# Patient Record
Sex: Male | Born: 1960
Health system: Southern US, Community
[De-identification: ages and names within clinical notes are randomized; demographics above are authoritative.]

## PROBLEM LIST (undated history)

## (undated) DIAGNOSIS — T7840XA Allergy, unspecified, initial encounter: Secondary | ICD-10-CM

## (undated) HISTORY — DX: Allergy, unspecified, initial encounter: T78.40XA

---

## 2008-08-09 ENCOUNTER — Emergency Department (HOSPITAL_COMMUNITY): Admission: EM | Admit: 2008-08-09 | Discharge: 2008-08-09 | Payer: Self-pay | Admitting: Emergency Medicine

## 2008-09-04 ENCOUNTER — Ambulatory Visit: Payer: Self-pay | Admitting: Vascular Surgery

## 2009-08-04 ENCOUNTER — Ambulatory Visit: Payer: Self-pay | Admitting: Vascular Surgery

## 2010-02-09 ENCOUNTER — Encounter: Payer: Self-pay | Admitting: Neurology

## 2010-06-02 NOTE — Procedures (Signed)
CAROTID DUPLEX EXAM   INDICATION:  Right carotid bruit.   HISTORY:  Diabetes:  No.  Cardiac:  No.  Hypertension:  No.  Smoking:  Quit.  Previous Surgery:  No.  CV History:  No.  Amaurosis Fugax No, Paresthesias No, Hemiparesis No                                       RIGHT             LEFT  Brachial systolic pressure:         134               140  Brachial Doppler waveforms:         WNL               WNL  Vertebral direction of flow:        Antegrade         Antegrade  DUPLEX VELOCITIES (cm/sec)  CCA peak systolic                   92                98  ECA peak systolic                   123               58  ICA peak systolic                   57                61  ICA end diastolic                   25                28  PLAQUE MORPHOLOGY:                  None              None  PLAQUE AMOUNT:                      None              None  PLAQUE LOCATION:                    None              None   IMPRESSION:  Patent bilateral internal carotid artery with no evidence  of stenosis.   ___________________________________________  Larina Earthly, M.D.   AC/MEDQ  D:  09/04/2008  T:  09/04/2008  Job:  [id number]

## 2013-07-18 HISTORY — PX: FRACTURE SURGERY: SHX138

## 2013-10-04 ENCOUNTER — Ambulatory Visit (INDEPENDENT_AMBULATORY_CARE_PROVIDER_SITE_OTHER): Payer: Self-pay | Admitting: Family Medicine

## 2013-10-04 VITALS — BP 128/82 | HR 102 | Temp 97.9°F | Resp 16 | Wt 191.6 lb

## 2013-10-04 DIAGNOSIS — R197 Diarrhea, unspecified: Secondary | ICD-10-CM

## 2013-10-04 LAB — COMPREHENSIVE METABOLIC PANEL
ALBUMIN: 4.6 g/dL (ref 3.5–5.2)
ALK PHOS: 75 U/L (ref 39–117)
ALT: 19 U/L (ref 0–53)
AST: 17 U/L (ref 0–37)
BUN: 9 mg/dL (ref 6–23)
CHLORIDE: 102 meq/L (ref 96–112)
CO2: 27 meq/L (ref 19–32)
Calcium: 10.4 mg/dL (ref 8.4–10.5)
Creat: 0.73 mg/dL (ref 0.50–1.35)
GLUCOSE: 85 mg/dL (ref 70–99)
POTASSIUM: 4.5 meq/L (ref 3.5–5.3)
SODIUM: 139 meq/L (ref 135–145)
TOTAL PROTEIN: 7.2 g/dL (ref 6.0–8.3)
Total Bilirubin: 0.8 mg/dL (ref 0.2–1.2)

## 2013-10-04 LAB — POCT CBC
Granulocyte percent: 65.6 %G (ref 37–80)
HEMATOCRIT: 49.2 % (ref 43.5–53.7)
HEMOGLOBIN: 16.2 g/dL (ref 14.1–18.1)
LYMPH, POC: 2 (ref 0.6–3.4)
MCH: 31.6 pg — AB (ref 27–31.2)
MCHC: 32.9 g/dL (ref 31.8–35.4)
MCV: 95.9 fL (ref 80–97)
MID (cbc): 0.5 (ref 0–0.9)
MPV: 7.7 fL (ref 0–99.8)
POC Granulocyte: 4.6 (ref 2–6.9)
POC LYMPH PERCENT: 27.9 %L (ref 10–50)
POC MID %: 6.5 %M (ref 0–12)
Platelet Count, POC: 302 10*3/uL (ref 142–424)
RBC: 5.13 M/uL (ref 4.69–6.13)
RDW, POC: 13.1 %
WBC: 7 10*3/uL (ref 4.6–10.2)

## 2013-10-04 MED ORDER — METRONIDAZOLE 500 MG PO TABS: 500.0000 mg | ORAL_TABLET | Freq: Three times a day (TID) | ORAL | Status: DC

## 2013-10-04 MED ORDER — CIPROFLOXACIN HCL 500 MG PO TABS: 500.0000 mg | ORAL_TABLET | Freq: Two times a day (BID) | ORAL | Status: DC

## 2013-10-04 NOTE — Progress Notes (Signed)
Subjective:    Patient ID: Justin Rios, male    DOB: January 02, 1961, 53 y.o.   MRN: [id number]  10/04/2013  GI Problem   HPI This 53 y.o. male presents for evaluation of abdominal pain, nausea, yellow fuzzy stool that is loose.  +Stomach cramps.  Onset one week ago.  Tried Pepto Bismol and Imodium.  No vomiting.  Last episode of diarrhea, +clear stool with red spots/specks in it.  One week ago, had take out [place] [employer] fried rice; then developed diarrhea two days later.  No chills/sweats; no fever.  Eating toasts, bananas, ginger snaps, potatoes, water, gatorade; drinking plenty of fluids.  Has been taking antibiotics three weeks ago for cellulitis; almost lost leg due to infection.  Then dislocated shoulder.  No recent travel. No sick contacts.  No recent camping. Took antibiotics for one month total due to severe cellulitis.  Took Clindamycin and Bactrim for cellulitis; developed a rash on Bactrim.  Having 3-6 stools per day on average.  Treatment of cellulitis at [facility]  Recently moved from [place] six months ago.   PCP:  none   Review of Systems  Constitutional: Negative for fever, chills, diaphoresis and fatigue.  Gastrointestinal: Positive for abdominal pain, diarrhea and blood in stool. Negative for nausea, vomiting, abdominal distention, anal bleeding and rectal pain.  Skin: Negative for rash.  Neurological: Negative for dizziness, light-headedness and headaches.    Past Medical History  Diagnosis Date  . Allergy    Past Surgical History  Procedure Laterality Date  . Fracture surgery  07/18/2013    R ankle surgery; [facility]   Allergies  Allergen Reactions  . Other     Pt. Is allergic to an antibiotic but can not remember the name   Current Outpatient Prescriptions  Medication Sig Dispense Refill  . oxyCODONE (ROXICODONE) 15 MG immediate release tablet Take 15 mg by mouth every 4 (four) hours as needed for pain.      . ciprofloxacin (CIPRO) 500 MG tablet Take 1 tablet  (500 mg total) by mouth 2 (two) times daily.  20 tablet  0  . metroNIDAZOLE (FLAGYL) 500 MG tablet Take 1 tablet (500 mg total) by mouth 3 (three) times daily.  30 tablet  0   No current facility-administered medications for this visit.       Objective:    BP 128/82  Pulse 102  Temp(Src) 97.9 F (36.6 C) (Oral)  Resp 16  Wt 191 lb 9.6 oz (86.909 kg)  SpO2 97% Physical Exam  Nursing note and vitals reviewed. Constitutional: He is oriented to person, place, and time. He appears well-developed and well-nourished. No distress.  In wheelchair.  HENT:  Head: Normocephalic and atraumatic.  Mouth/Throat: Oropharynx is clear and moist.  Eyes: Conjunctivae and EOM are normal. Pupils are equal, round, and reactive to light.  Neck: Normal range of motion. Neck supple. Carotid bruit is not present. No thyromegaly present.  Cardiovascular: Normal rate, regular rhythm, normal heart sounds and intact distal pulses.  Exam reveals no gallop and no friction rub.   No murmur heard. Pulmonary/Chest: Effort normal and breath sounds normal. He has no wheezes. He has no rales.  Abdominal: Soft. Bowel sounds are normal. He exhibits no distension and no mass. There is no tenderness. There is no rebound and no guarding.  Musculoskeletal:  CAM walker on LLE.  Lymphadenopathy:    He has no cervical adenopathy.  Neurological: He is alert and oriented to person, place, and time. No cranial nerve  deficit.  Skin: Skin is warm and dry. No rash noted. He is not diaphoretic.  Psychiatric: He has a normal mood and affect. His behavior is normal.        Assessment & Plan:   1. Diarrhea    1. Diarrhea:  New.  Onset one week ago. Having 3-6 stools per day.  Recent prolonged antibiotics for LLE cellulitis; recently prescribed Clindamycin and Bactrim.  Send stool culture and C. Difficile studies.  Treat empirically with Cipro and Flagyl.  BRAT diet, hydration. RTC for acute worsening.  Very concerning for C.  Difficile infection; expressed this concern to patient.   Meds ordered this encounter  Medications  . oxyCODONE (ROXICODONE) 15 MG immediate release tablet    Sig: Take 15 mg by mouth every 4 (four) hours as needed for pain.  . ciprofloxacin (CIPRO) 500 MG tablet    Sig: Take 1 tablet (500 mg total) by mouth 2 (two) times daily.    Dispense:  20 tablet    Refill:  0  . metroNIDAZOLE (FLAGYL) 500 MG tablet    Sig: Take 1 tablet (500 mg total) by mouth 3 (three) times daily.    Dispense:  30 tablet    Refill:  0    No Follow-up on file.    Nilda Simmer, M.D.  [facility] [address] [place], [place]  [address] [phone] phone [phone] fax

## 2013-10-04 NOTE — Patient Instructions (Signed)

## 2013-10-05 ENCOUNTER — Telehealth: Payer: Self-pay

## 2013-10-05 NOTE — Telephone Encounter (Signed)
Spoke to Justin Rios- advised we will call him as soon as results are in. He would like a personal call from Dr. Katrinka Blazing- he has a personal question he states he does not feel comfortable discussing with me.

## 2013-10-05 NOTE — Telephone Encounter (Signed)
PT CALLED TO REQUEST THAT HIS LAB RESULTS BE REVIEWED WITH HIM.

## 2013-10-07 LAB — CLOSTRIDIUM DIFFICILE BY PCR: Toxigenic C. Difficile by PCR: DETECTED — CR

## 2013-10-08 LAB — STOOL CULTURE

## 2013-10-08 NOTE — Telephone Encounter (Signed)
Spoke to patient --- 1.  Advised of +Clostridium Difficile; recommend stopping Cipro and continuing Metronidazole.  2.  Pt never started Metronidazole due to fear of side effects. 3. Diarrhea has improved to 2-3 stools per day on Cipro.  A/P:  C. Difficile colitis:  Start Metronidazole; call if no improvement in ten days.  Continue with BRAT diet, hydration.

## 2013-10-26 ENCOUNTER — Encounter (HOSPITAL_BASED_OUTPATIENT_CLINIC_OR_DEPARTMENT_OTHER): Payer: Self-pay | Admitting: Emergency Medicine

## 2013-10-26 ENCOUNTER — Telehealth: Payer: Self-pay

## 2013-10-26 ENCOUNTER — Emergency Department (HOSPITAL_BASED_OUTPATIENT_CLINIC_OR_DEPARTMENT_OTHER)
Admission: EM | Admit: 2013-10-26 | Discharge: 2013-10-26 | Disposition: A | Discharge status: Home / Self Care | Payer: Self-pay | Attending: Emergency Medicine | Admitting: Emergency Medicine

## 2013-10-26 DIAGNOSIS — Z79899 Other long term (current) drug therapy: Secondary | ICD-10-CM | POA: Insufficient documentation

## 2013-10-26 DIAGNOSIS — Z87891 Personal history of nicotine dependence: Secondary | ICD-10-CM | POA: Insufficient documentation

## 2013-10-26 DIAGNOSIS — R197 Diarrhea, unspecified: Secondary | ICD-10-CM | POA: Insufficient documentation

## 2013-10-26 DIAGNOSIS — Z792 Long term (current) use of antibiotics: Secondary | ICD-10-CM | POA: Insufficient documentation

## 2013-10-26 LAB — CBC WITH DIFFERENTIAL/PLATELET
BASOS PCT: 0 % (ref 0–1)
Basophils Absolute: 0 10*3/uL (ref 0.0–0.1)
EOS ABS: 0.2 10*3/uL (ref 0.0–0.7)
Eosinophils Relative: 2 % (ref 0–5)
HCT: 43.6 % (ref 39.0–52.0)
Hemoglobin: 15.2 g/dL (ref 13.0–17.0)
LYMPHS ABS: 1.2 10*3/uL (ref 0.7–4.0)
Lymphocytes Relative: 12 % (ref 12–46)
MCH: 32 pg (ref 26.0–34.0)
MCHC: 34.9 g/dL (ref 30.0–36.0)
MCV: 91.8 fL (ref 78.0–100.0)
Monocytes Absolute: 1.1 10*3/uL — ABNORMAL HIGH (ref 0.1–1.0)
Monocytes Relative: 11 % (ref 3–12)
NEUTROS PCT: 75 % (ref 43–77)
Neutro Abs: 7.5 10*3/uL (ref 1.7–7.7)
PLATELETS: 218 10*3/uL (ref 150–400)
RBC: 4.75 MIL/uL (ref 4.22–5.81)
RDW: 13.2 % (ref 11.5–15.5)
WBC: 10 10*3/uL (ref 4.0–10.5)

## 2013-10-26 LAB — COMPREHENSIVE METABOLIC PANEL
ALBUMIN: 3.8 g/dL (ref 3.5–5.2)
ALK PHOS: 69 U/L (ref 39–117)
ALT: 16 U/L (ref 0–53)
AST: 14 U/L (ref 0–37)
Anion gap: 16 — ABNORMAL HIGH (ref 5–15)
BUN: 14 mg/dL (ref 6–23)
CO2: 23 mEq/L (ref 19–32)
Calcium: 9.9 mg/dL (ref 8.4–10.5)
Chloride: 103 mEq/L (ref 96–112)
Creatinine, Ser: 0.7 mg/dL (ref 0.50–1.35)
GFR calc Af Amer: >90 mL/min (ref >90)
GFR calc non Af Amer: >90 mL/min (ref >90)
GLUCOSE: 126 mg/dL — AB (ref 70–99)
POTASSIUM: 3.8 meq/L (ref 3.7–5.3)
SODIUM: 142 meq/L (ref 137–147)
TOTAL PROTEIN: 7.1 g/dL (ref 6.0–8.3)
Total Bilirubin: 0.8 mg/dL (ref 0.3–1.2)

## 2013-10-26 MED ORDER — SACCHAROMYCES BOULARDII 250 MG PO CAPS: 250.0000 mg | ORAL_CAPSULE | Freq: Two times a day (BID) | ORAL | Status: DC

## 2013-10-26 MED ORDER — VANCOMYCIN HCL 250 MG PO CAPS: ORAL_CAPSULE | ORAL | Status: DC

## 2013-10-26 MED ORDER — VANCOMYCIN HCL IN DEXTROSE 1-5 GM/200ML-% IV SOLN: 1000.0000 mg | Freq: Once | INTRAVENOUS | Status: DC

## 2013-10-26 MED ORDER — SODIUM CHLORIDE 0.9 % IV SOLN
Freq: Once | INTRAVENOUS | Status: AC
  Administered 2013-10-26: 1000 mL via INTRAVENOUS

## 2013-10-26 NOTE — ED Notes (Signed)
Diarrhea 2 days. He was recently treated for cdiff.
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
## 2013-10-26 NOTE — Telephone Encounter (Signed)
Pt of Dr. Katrinka BlazingSmith says that he has taken his medication prescribe on last visit, but all of his symptoms have come back, please advise pt

## 2013-10-26 NOTE — Telephone Encounter (Signed)
Dr Katrinka BlazingSmith advised Treat empirically with Cipro and Flagyl. BRAT diet, hydration. RTC for acute worsening. Very concerning for C. Difficile infection; expressed this concern to patient.  Called advised patient to return to clinic since symptoms have returned. He indicates he will return here [place], since [place] [address] [place].

## 2013-10-26 NOTE — ED Notes (Signed)
Consult to case manager for help with prescription. Waiting on return call

## 2013-10-26 NOTE — Care Management (Signed)
ED CM received call from Raynelle FanningJulie RN at [facility]. Presented [facility] ED with c/o diarrhea + C- diff tx'd with cipro and flagyl not improved. Pt being discharged with prescription for PO vanco 250mg   Pt is uninsured.  Spoke with patient by phone confirmed information. Pt is eligible for MATCH. Discussed MATCH program and the guidelines including the  $3 co-pay per prescription. Patient agreeable with plan.  Pt enrolled and MATCH [facility] [phone] with list of participating pharmacies.  Patient was instructed to present to a  MATCH with prescriptions to a participating Pharmacies from the list given. Pt verbalized  understanding and appreciation. Pt verbalized understanding teach back done. Discussed disposition plan with Raynelle FanningJulie who is agreeable. No further ED CM needs identified.

## 2013-10-26 NOTE — Discharge Instructions (Signed)
Clostridium Difficile Toxin °This is a test which may be done when a patient has diarrhea that lasts for several days, or has abdominal pain, fever, and nausea after antibiotic therapy.  °This test looks for the presence of Clostridium difficile (C.diff.) toxin in a stool sample. C.diff. is a germ (bacterium) that is one of the groups of bacteria that are usually in the colon, called "normal flora." If something upsets the growth of the other normal flora, C.diff. may overgrow and disrupt the balance of bacteria in the colon. C. diff. may produce two toxins, A and B. The combination of overgrowth and toxins may cause prolonged diarrhea. The toxins may damage the lining of the colon and lead to colitis.  °While some cases of C. diff. diarrhea and colitis do not require treatment, others require specific oral antibiotic therapy. Most patients improve as the normal flora re-establishes itself, but about some may have one or more relapses, with symptoms and detectible toxin levels coming back. °PREPARATION FOR TEST °There is no special preparation for the test. A fresh stool sample is collected in a sterile container. The sample should not be mixed with urine or water. The stool should be taken to the lab within an hour. It may be refrigerated or frozen and taken to the lab as soon as possible. The container should be labeled with your name and the date and time of the stool collection.  °NORMAL FINDINGS °Negative Tissue Culture (no toxin identified) °Ranges for normal findings may vary among different laboratories and hospitals. You should always check with your doctor after having lab work or other tests done to discuss the meaning of your test results and whether your values are considered within normal limits. °MEANING OF TEST  °Your caregiver will go over the test results with you and discuss the importance and meaning of your results, as well as treatment options and the need for additional tests if  necessary. °OBTAINING THE TEST RESULTS °It is your responsibility to obtain your test results. Ask the lab or department performing the test when and how you will get your results. °Document Released: 01/28/2004 Document Revised: 03/29/2011 Document Reviewed: 12/14/2007 °ExitCare® Patient Information ©2015 [employer]. This information is not intended to replace advice given to you by your health care provider. Make sure you discuss any questions you have with your health care provider. ° °

## 2013-10-26 NOTE — Care Management (Addendum)
ED CM called [facility] on [address] regarding price of prescription for self-pay. Cost will be $5000.  Spoke with L. Keenan BachelorSofia PA-C concerning the cost of medication, my recommendation is  to prescribe a more affordable treatment. She stated will restart on Flagyl 500mg  PO 3 x day x 3 weeks regimen. Attepted to contact patient with change in treatment plan. LVM to return call ED CM will follow up in the am.

## 2013-10-27 MED ORDER — METRONIDAZOLE 500 MG PO TABS: 500.0000 mg | ORAL_TABLET | Freq: Three times a day (TID) | ORAL | Status: DC

## 2013-10-27 NOTE — ED Provider Notes (Signed)
Patient returns today, unable to fill Rx's for Vancomycin and Florcaster prescribed last night for C. Diff. secondary to financial difficulty. Rx provided for 14 days of Flagyl and follow up with Dr. Russella DarStark this week for further mangement.    Arnoldo HookerShari A Venesha Petraitis, PA-C 10/27/13 1505

## 2013-10-29 LAB — GI PATHOGEN PANEL BY PCR, STOOL
C difficile toxin A/B: POSITIVE
Campylobacter by PCR: NEGATIVE
Cryptosporidium by PCR: NEGATIVE
E COLI (ETEC) LT/ST: NEGATIVE
E COLI (STEC): NEGATIVE
E COLI 0157 BY PCR: NEGATIVE
G lamblia by PCR: NEGATIVE
Norovirus GI/GII: NEGATIVE
Rotavirus A by PCR: NEGATIVE
SALMONELLA BY PCR: NEGATIVE
SHIGELLA BY PCR: NEGATIVE

## 2013-10-29 NOTE — ED Notes (Signed)
Patient called to state that he can not afford his medications and asked if we could help.  Spoke with pharmacy staff and Sharyl NimrodMeredith will follow up with the patient .

## 2013-10-30 NOTE — ED Provider Notes (Signed)
History/physical exam/procedure(s) were performed by non-physician practitioner and as supervising physician I was immediately available for consultation/collaboration. I have reviewed all notes and am in agreement with care and plan.   Hilario Quarryanielle S Tashera Montalvo, MD 10/30/13 [phone]

## 2013-10-31 NOTE — ED Provider Notes (Signed)
History/physical exam/procedure(s) were performed by non-physician practitioner and as supervising physician I was immediately available for consultation/collaboration. I have reviewed all notes and am in agreement with care and plan.   Hilario Quarryanielle S Taitum Alms, MD 10/31/13 [phone]

## 2013-11-06 ENCOUNTER — Telehealth (HOSPITAL_BASED_OUTPATIENT_CLINIC_OR_DEPARTMENT_OTHER): Payer: Self-pay

## 2013-11-06 NOTE — Telephone Encounter (Signed)
Pt informed (+) C Diff, continue w/Vancomycin as prescribed.

## 2013-11-29 ENCOUNTER — Ambulatory Visit: Payer: Self-pay | Attending: General Practice

## 2013-11-29 DIAGNOSIS — M79661 Pain in right lower leg: Secondary | ICD-10-CM | POA: Insufficient documentation

## 2013-11-29 DIAGNOSIS — R262 Difficulty in walking, not elsewhere classified: Secondary | ICD-10-CM | POA: Insufficient documentation

## 2013-11-29 DIAGNOSIS — M25671 Stiffness of right ankle, not elsewhere classified: Secondary | ICD-10-CM | POA: Insufficient documentation

## 2013-12-04 ENCOUNTER — Ambulatory Visit: Payer: Self-pay | Admitting: Rehabilitation

## 2013-12-07 ENCOUNTER — Ambulatory Visit: Payer: Self-pay | Admitting: Rehabilitation

## 2013-12-11 ENCOUNTER — Ambulatory Visit: Payer: Self-pay | Admitting: Rehabilitation

## 2013-12-18 ENCOUNTER — Ambulatory Visit: Payer: Self-pay | Attending: General Practice | Admitting: Rehabilitation

## 2013-12-18 DIAGNOSIS — M25671 Stiffness of right ankle, not elsewhere classified: Secondary | ICD-10-CM | POA: Insufficient documentation

## 2013-12-18 DIAGNOSIS — R262 Difficulty in walking, not elsewhere classified: Secondary | ICD-10-CM | POA: Insufficient documentation

## 2013-12-18 DIAGNOSIS — M79661 Pain in right lower leg: Secondary | ICD-10-CM | POA: Insufficient documentation

## 2013-12-21 ENCOUNTER — Ambulatory Visit: Payer: Self-pay | Admitting: Rehabilitation

## 2013-12-25 ENCOUNTER — Ambulatory Visit: Payer: Self-pay | Admitting: Rehabilitation

## 2013-12-28 ENCOUNTER — Ambulatory Visit: Payer: Self-pay | Admitting: Rehabilitation

## 2013-12-31 ENCOUNTER — Ambulatory Visit: Payer: Self-pay | Admitting: Rehabilitation

## 2014-01-02 ENCOUNTER — Ambulatory Visit: Payer: Self-pay | Admitting: Rehabilitation

## 2014-01-09 ENCOUNTER — Ambulatory Visit: Payer: Self-pay

## 2014-01-16 ENCOUNTER — Ambulatory Visit: Payer: Self-pay | Admitting: Rehabilitation

## 2014-01-21 ENCOUNTER — Encounter: Payer: Self-pay | Admitting: Rehabilitation

## 2014-01-24 ENCOUNTER — Ambulatory Visit: Payer: Self-pay | Attending: General Practice | Admitting: Rehabilitation

## 2014-01-24 DIAGNOSIS — M25671 Stiffness of right ankle, not elsewhere classified: Secondary | ICD-10-CM | POA: Insufficient documentation

## 2014-01-24 DIAGNOSIS — M79661 Pain in right lower leg: Secondary | ICD-10-CM | POA: Insufficient documentation

## 2014-01-24 DIAGNOSIS — R262 Difficulty in walking, not elsewhere classified: Secondary | ICD-10-CM | POA: Insufficient documentation

## 2014-01-29 ENCOUNTER — Ambulatory Visit: Payer: Self-pay | Admitting: Rehabilitation

## 2014-01-31 ENCOUNTER — Ambulatory Visit: Payer: Self-pay | Admitting: Rehabilitation

## 2014-02-05 ENCOUNTER — Ambulatory Visit: Payer: Self-pay | Admitting: Rehabilitation

## 2014-02-07 ENCOUNTER — Ambulatory Visit: Payer: Self-pay | Admitting: Rehabilitation

## 2014-02-11 ENCOUNTER — Ambulatory Visit: Payer: Self-pay | Admitting: Rehabilitation

## 2014-02-14 ENCOUNTER — Ambulatory Visit: Payer: Self-pay | Admitting: Rehabilitation

## 2014-02-18 ENCOUNTER — Encounter: Payer: Self-pay | Admitting: Rehabilitation

## 2014-02-20 ENCOUNTER — Ambulatory Visit: Payer: Self-pay | Attending: General Practice | Admitting: Rehabilitation

## 2014-02-20 DIAGNOSIS — R262 Difficulty in walking, not elsewhere classified: Secondary | ICD-10-CM | POA: Insufficient documentation

## 2014-02-20 DIAGNOSIS — M25671 Stiffness of right ankle, not elsewhere classified: Secondary | ICD-10-CM | POA: Insufficient documentation

## 2014-02-20 DIAGNOSIS — M79661 Pain in right lower leg: Secondary | ICD-10-CM | POA: Insufficient documentation

## 2014-02-25 ENCOUNTER — Ambulatory Visit: Payer: Self-pay | Admitting: Rehabilitation

## 2014-02-27 ENCOUNTER — Ambulatory Visit: Payer: Self-pay | Admitting: Rehabilitation

## 2014-03-04 ENCOUNTER — Ambulatory Visit: Payer: Self-pay | Admitting: Rehabilitation

## 2014-03-07 ENCOUNTER — Encounter: Payer: Self-pay | Admitting: Rehabilitation

## 2014-03-07 ENCOUNTER — Ambulatory Visit: Payer: Self-pay | Admitting: Rehabilitation

## 2014-03-07 DIAGNOSIS — M25671 Stiffness of right ankle, not elsewhere classified: Secondary | ICD-10-CM

## 2014-03-07 DIAGNOSIS — M25571 Pain in right ankle and joints of right foot: Secondary | ICD-10-CM

## 2014-03-07 DIAGNOSIS — R262 Difficulty in walking, not elsewhere classified: Secondary | ICD-10-CM

## 2014-03-07 NOTE — Therapy (Signed)
[facility] [address]  [address] [place], [place], [address] Phone: [phone]   Fax:  [phone]  Physical Therapy Treatment  Patient Details  Name: Kaladin Noseworthy MRN: [id number] Date of Birth: [birth year] Referring Provider:  Forest Becker, MD  Encounter Date: 03/07/2014      PT End of Session - 03/07/14 1159    Visit Number 23   Number of Visits 30   Date for PT Re-Evaluation 03/28/14   PT Start Time 1100   PT Stop Time 1150   PT Time Calculation (min) 50 min      Past Medical History  Diagnosis Date  . Allergy     Past Surgical History  Procedure Laterality Date  . Fracture surgery  07/18/2013    R ankle surgery; [facility]    There were no vitals taken for this visit.  Visit Diagnosis:  Pain in joint, ankle and foot, right - Plan: PT plan of care cert/re-cert  Stiffness of ankle joint, right - Plan: PT plan of care cert/re-cert  Difficulty walking - Plan: PT plan of care cert/re-cert      Subjective Assessment - 03/07/14 1154    Symptoms Pt reports that the JAS splint rep will be meeting [place] here to set up the ankle brace.  reports no significant changes but would like to extend PT visits for a few more weeks to maximize shouder ROM and strength and to assess JAS effects.   Currently in Pain? Yes   Pain Score 1    Pain Location Shoulder   Pain Orientation Right   Aggravating Factors  use   Pain Relieving Factors rest          [facility] PT Assessment - 03/07/14 0001    AROM   Right Shoulder Flexion 150 Degrees   Right Shoulder ABduction 160 Degrees   Right Shoulder Internal Rotation 80 Degrees   Right Shoulder External Rotation 80 Degrees   Right Ankle Dorsiflexion 0   Strength   Right Shoulder Flexion 4+/5   Right Shoulder Extension 5/5   Right Shoulder ABduction 4-/5  pn   Right Shoulder Internal Rotation 5/5   Right Shoulder External Rotation 5/5   Right Ankle Dorsiflexion 4+/5   Right  Ankle Plantar Flexion 4+/5   Right Ankle Inversion 4+/5   Right Ankle Eversion 4+/5                  [facility] Adult PT Treatment/Exercise - 03/07/14 0001    High Level Balance   High Level Balance Comments Sl stance trampoline toss red ball 2x30"   Exercises   Exercises Shoulder   Shoulder Exercises: Seated   Other Seated Exercises overhead press BATCA  B 45# 2x15   Shoulder Exercises: Standing   External Rotation --  R   Theraband Level (Shoulder External Rotation) Level 3 (Green)  2x15   Internal Rotation Right   Theraband Level (Shoulder Internal Rotation) Level 3 (Green)  2x15   Flexion Right;15 reps;Theraband  green; 2 sets   ABduction Right;15 reps;Theraband  green; 2 sets   Row Right  BATCA 35# 2x10   Other Standing Exercises green ball rollups at wall with lift off 2x15   Other Standing Exercises pull down 35# B 2x15   Shoulder Exercises: ROM/Strengthening   Pushups 15 reps  x2 at counter   Shoulder Exercises: Body Blade   Flexion 15 seconds;2 reps   ABduction 15 seconds;2 reps  PT Long Term Goals - 03/07/14 1208    PT LONG TERM GOAL #1   Title be independent with HEP for the ankle and shoulder  3/17   Time 4   Period Weeks   Status On-going   PT LONG TERM GOAL #2   Title ambulate without AD with minimal gait deviations  3/17   Time 4   Period Weeks   Status On-going   PT LONG TERM GOAL #3   Title negotiate stairs with step through sequence 3/17   Time 4   Period Weeks   Status On-going   PT LONG TERM GOAL #4   Title improve ankle DF to 5 to normalize gait 3/17   Time 4   Period Weeks   Status On-going   PT LONG TERM GOAL #5   Title FOTO improved with no greater than 40%  limitation    3/17   Baseline 63%   Time 4   Period Weeks   Status On-going               Plan - 03/07/14 1202    Clinical Impression Statement Pt continues to exhibit improving shoulder ROM, but with a plateau in ankle ROM at  neutral.  The plan is to continue to gain ankle ROM with the use of the JAS splint and continued home stretching.  Pain has been ranging from a 0-5/10 at the shoulder and ankle recently.  Pt denies any ADL limitations regarding his shoulder but still notices the weakness.  Pt's gait is still limited by lack of dorsiflexion ROM available.     Pt will benefit from skilled therapeutic intervention in order to improve on the following deficits Abnormal gait;Decreased strength;Decreased range of motion   Rehab Potential Excellent   Clinical Impairments Affecting Rehab Potential none   PT Frequency 2x / week   PT Duration 4 weeks   PT Treatment/Interventions ADLs/Self Care Home Management;Gait training;Therapeutic exercise;Balance training   PT Next Visit Plan continue ankle proprioception and balance training along with shoulder AROM and strengthening all planes   Consulted and Agree with Plan of Care Patient        Problem List There are no active problems to display for this patient.   Idamae Lusherevis, Kara R, DPT, CMP 03/07/2014, 12:20 PM  [facility] [address]  [address] [place], [place], [address] Phone: [phone]   Fax:  [phone]

## 2014-03-11 ENCOUNTER — Ambulatory Visit: Payer: Self-pay | Admitting: Rehabilitation

## 2014-03-13 ENCOUNTER — Ambulatory Visit: Payer: Self-pay | Admitting: Rehabilitation

## 2014-03-18 ENCOUNTER — Ambulatory Visit: Payer: Self-pay | Admitting: Rehabilitation

## 2014-03-18 ENCOUNTER — Encounter: Payer: Self-pay | Admitting: Rehabilitation

## 2014-03-18 DIAGNOSIS — M25671 Stiffness of right ankle, not elsewhere classified: Secondary | ICD-10-CM

## 2014-03-18 DIAGNOSIS — M25511 Pain in right shoulder: Secondary | ICD-10-CM

## 2014-03-18 NOTE — Patient Instructions (Signed)
Call JAS splint representative regarding issues with the splint. Continue ankle stretches and use of brace as tolerated at home

## 2014-03-18 NOTE — Therapy (Addendum)
[facility] [address]  [place], [place], [address] Phone: [phone]   Fax:  [phone]  Physical Therapy Treatment  Patient Details  Name: Justin Rios MRN: [id number] Date of Birth: 23-Apr-1960 Referring Provider:  Val Eagle, MD  Encounter Date: 03/18/2014      PT End of Session - 03/18/14 1156    Visit Number 24   Number of Visits 30   Date for PT Re-Evaluation 03/28/14   PT Start Time 1100   PT Stop Time 1030   PT Time Calculation (min) 56 min      Past Medical History  Diagnosis Date  . Allergy     Past Surgical History  Procedure Laterality Date  . Fracture surgery  07/18/2013    R ankle surgery; [facility]    There were no vitals taken for this visit.  Visit Diagnosis:  Pain in joint, shoulder region, right  Stiffness of ankle joint, right      Subjective Assessment - 03/18/14 1059    Symptoms Reports increased pain with the JAS splint          [facility] PT Assessment - 03/18/14 0001    ROM / Strength   AROM / PROM / Strength AROM   AROM   Right Shoulder Flexion 155 Degrees   Right Shoulder ABduction 160 Degrees   Right Ankle Dorsiflexion 0                  [facility] Adult PT Treatment/Exercise - 03/18/14 0001    Exercises   Exercises Ankle   Shoulder Exercises: Seated   Elevation Strengthening;15 reps  2 sets at [facility]; 35pounds   Shoulder Exercises: Standing   External Rotation Strengthening  neutral and at 90deg of abd; 3x10 assist for abduction   Theraband Level (Shoulder External Rotation) Level 3 (Green)   Internal Rotation Strengthening  3x10 green theraband;neutral and at 90deg of abd   Theraband Level (Shoulder Internal Rotation) Level 3 (Green)   Other Standing Exercises D2 extension; 3x10 GTB   Manual Therapy   Manual Therapy Other (comment)  stretches: prone quad stretch 3x20", DF stretch 3x20"   Ankle Exercises: Standing   Heel Raises 20 reps  at  [facility]; R only 2x5      Reviewed JAS splint usage.  Called rep.                PT Long Term Goals - 03/07/14 1208    PT LONG TERM GOAL #1   Title be independent with HEP for the ankle and shoulder  3/17   Time 4   Period Weeks   Status On-going   PT LONG TERM GOAL #2   Title ambulate without AD with minimal gait deviations  3/17   Time 4   Period Weeks   Status On-going   PT LONG TERM GOAL #3   Title negotiate stairs with step through sequence 3/17   Time 4   Period Weeks   Status On-going   PT LONG TERM GOAL #4   Title improve ankle DF to 5 to normalize gait 3/17   Time 4   Period Weeks   Status On-going   PT LONG TERM GOAL #5   Title FOTO improved with no greater than 40%  limitation    3/17   Baseline 63%   Time 4   Period Weeks   Status On-going  Plan - 03/18/14 1156    Clinical Impression Statement Tolerated treatment well. Excellent progression of shoulder exercises. Continues with ankle Dorsiflexion to 0-1deg.    PT Next Visit Plan JAS splint representative will be present next visit. Continue shoulder strengthening, Ankle ROM/balance        Problem List There are no active problems to display for this patient.   Stark Bray, DPT, CMP 03/18/2014, 12:05 PM  [facility] [address]  [place], [place], [address] Phone: [phone]   Fax:  [phone]       PHYSICAL THERAPY DISCHARGE SUMMARY  Visits from Start of Care: 24  Current functional level related to goals / functional outcomes: See above; pt did not return to PT therefore unknown whether goals met.  Pt stated he would call to schedule and never scheduled.   Remaining deficits: See above   Education / Equipment: HEP, JAS splint  Plan: Patient agrees to discharge.  Patient goals were not met. Patient is being discharged due to not returning since the last visit.  ?????    Laureen Abrahams, PT, DPT 05/02/2014 7:29 AM  [facility] [place], [place] [address]  [phone] (office) [phone] (fax)

## 2014-03-20 ENCOUNTER — Ambulatory Visit: Payer: Self-pay | Admitting: Rehabilitation

## 2014-03-26 ENCOUNTER — Ambulatory Visit: Payer: Self-pay | Admitting: Rehabilitation

## 2014-03-29 ENCOUNTER — Ambulatory Visit: Payer: Self-pay | Admitting: Rehabilitation

## 2014-04-04 ENCOUNTER — Ambulatory Visit: Payer: Self-pay | Admitting: Rehabilitation

## 2014-04-15 ENCOUNTER — Ambulatory Visit: Payer: Self-pay | Admitting: Rehabilitation

## 2014-10-02 ENCOUNTER — Encounter (HOSPITAL_BASED_OUTPATIENT_CLINIC_OR_DEPARTMENT_OTHER): Payer: Self-pay | Admitting: *Deleted

## 2014-10-02 ENCOUNTER — Emergency Department (HOSPITAL_BASED_OUTPATIENT_CLINIC_OR_DEPARTMENT_OTHER)
Admission: EM | Admit: 2014-10-02 | Discharge: 2014-10-02 | Disposition: A | Discharge status: Home / Self Care | Payer: Self-pay | Attending: Emergency Medicine | Admitting: Emergency Medicine

## 2014-10-02 DIAGNOSIS — Z87891 Personal history of nicotine dependence: Secondary | ICD-10-CM | POA: Insufficient documentation

## 2014-10-02 DIAGNOSIS — H6123 Impacted cerumen, bilateral: Secondary | ICD-10-CM | POA: Insufficient documentation

## 2014-10-02 NOTE — Discharge Instructions (Signed)
Please read and follow all provided instructions.  Your diagnoses today include:  1. Cerumen impaction, bilateral    Tests performed today include:  Vital signs. See below for your results today.   Medications prescribed:   None  Home care instructions:  Follow any educational materials contained in this packet.  Follow-up instructions: Please follow-up with your primary care provider as needed for further evaluation of your symptoms.  Return instructions:   Please return to the Emergency Department if you experience worsening symptoms.   Please return if you have any other emergent concerns.  Additional Information:  Your vital signs today were: BP 162/109 mmHg   Pulse 78   Temp(Src) 98.5 F (36.9 C) (Oral)   Resp 16   Ht  (1.778 m)   Wt 200 lb (90.719 kg)   BMI 28.70 kg/m2   SpO2 100% If your blood pressure (BP) was elevated above 135/85 this visit, please have this repeated by your doctor within one month. ---------------

## 2014-10-02 NOTE — ED Provider Notes (Signed)
CSN: [id number]     Arrival date & time 10/02/14  1254 History   First MD Initiated Contact with Patient 10/02/14 1322     Chief Complaint  Patient presents with  . Otalgia     (Consider location/radiation/quality/duration/timing/severity/associated sxs/prior Treatment) HPI Comments: Patient presents with complaint of right ear pain and decreased hearing for the past month, worse over the past couple of days. Patient states that he does use Q-tips at home at home but does report any acute injury. He does not have much in the way of pain, just a pressure. No other fevers, nausea, vomiting. No runny nose. Patient has tinnitus that is at baseline. He has been worked up for this in the past. No other treatments prior to arrival.  Patient is a 54 y.o. male presenting with ear pain. The history is provided by the patient.  Otalgia Associated symptoms: hearing loss   Associated symptoms: no abdominal pain, no congestion, no cough, no diarrhea, no fever, no headaches, no rash, no rhinorrhea, no sore throat and no vomiting     Past Medical History  Diagnosis Date  . Allergy    Past Surgical History  Procedure Laterality Date  . Fracture surgery  07/18/2013    R ankle surgery; [facility]   No family history on file. Social History  Substance Use Topics  . Smoking status: Former Games developer  . Smokeless tobacco: None  . Alcohol Use: No    Review of Systems  Constitutional: Negative for fever, chills and fatigue.  HENT: Positive for ear pain and hearing loss. Negative for congestion, rhinorrhea, sinus pressure and sore throat.   Eyes: Negative for redness.  Respiratory: Negative for cough and wheezing.   Gastrointestinal: Negative for nausea, vomiting, abdominal pain and diarrhea.  Genitourinary: Negative for dysuria.  Musculoskeletal: Negative for myalgias and neck stiffness.  Skin: Negative for rash.  Neurological: Negative for headaches.  Hematological: Negative for adenopathy.     Allergies  Other  Home Medications   Prior to Admission medications   Medication Sig Start Date End Date Taking? Authorizing Provider  ciprofloxacin (CIPRO) 500 MG tablet Take 1 tablet (500 mg total) by mouth 2 (two) times daily. Patient not taking: Reported on 03/07/2014 10/04/13   Ethelda Chick, MD  metroNIDAZOLE (FLAGYL) 500 MG tablet Take 1 tablet (500 mg total) by mouth 3 (three) times daily. Patient not taking: Reported on 03/07/2014 10/04/13   Ethelda Chick, MD  metroNIDAZOLE (FLAGYL) 500 MG tablet Take 1 tablet (500 mg total) by mouth 3 (three) times daily. Patient not taking: Reported on 03/07/2014 10/27/13   Elpidio Anis, PA-C  oxyCODONE (ROXICODONE) 15 MG immediate release tablet Take 15 mg by mouth every 4 (four) hours as needed for pain.    Historical Provider, MD  saccharomyces boulardii (FLORASTOR) 250 MG capsule Take 1 capsule (250 mg total) by mouth 2 (two) times daily. Patient not taking: Reported on 03/07/2014 10/26/13   Elson Areas, PA-C  vancomycin [facility]) 250 MG capsule Two tablets ( ) po qid x 21 days Patient not taking: Reported on 03/07/2014 10/26/13   Lonia Skinner Sofia, PA-C   BP 162/109 mmHg  Pulse 78  Temp(Src) 98.5 F (36.9 C) (Oral)  Resp 16  Ht  (1.778 m)  Wt 200 lb (90.719 kg)  BMI 28.70 kg/m2  SpO2 100%   Physical Exam  Constitutional: He appears well-developed and well-nourished.  HENT:  Head: Normocephalic and atraumatic.  Right Ear: Tympanic membrane, external ear and ear canal  normal. No drainage, swelling or tenderness. Tympanic membrane is not injected, not perforated and not erythematous. Decreased hearing is noted.  Left Ear: Tympanic membrane, external ear and ear canal normal. No drainage, swelling or tenderness. Tympanic membrane is not injected, not perforated and not erythematous.  Bilateral cerumen impaction. After clearing, TMs appear normal.  Eyes: Conjunctivae are normal.  Neck: Normal range of motion. Neck supple.   Pulmonary/Chest: No respiratory distress.  Neurological: He is alert.  Skin: Skin is warm and dry.  Psychiatric: He has a normal mood and affect.  Nursing note and vitals reviewed.   ED Course  Procedures (including critical care time) Labs Review Labs Reviewed - No data to display  Imaging Review No results found. I have personally reviewed and evaluated these images and lab results as part of my medical decision-making.   EKG Interpretation None       2:11 PM Patient seen and examined. Impaction cleared by nurse. Patient reports much improvement in hearing. Tenderness is at baseline. Normal exam after cerumen impaction cleared.   Patient counseled on measures to decrease chance of impaction.  Vital signs reviewed and are as follows: BP 162/109 mmHg  Pulse 78  Temp(Src) 98.5 F (36.9 C) (Oral)  Resp 16  Ht 5\' 10"  (1.778 m)  Wt 200 lb (90.719 kg)  BMI 28.70 kg/m2  SpO2 100%    MDM   Final diagnoses:  Cerumen impaction, bilateral   Patient with mild ear pain and hearing loss. Cerumen impaction on exam, symptoms improved after clearing.   Renne Crigler, PA-C 10/02/14 1417  Benjiman Core, MD 10/03/14 1540

## 2014-10-02 NOTE — ED Notes (Signed)
Patient c/o R ear pain that has grown worse over the past few days, he states sounds are sounding muffled

## 2014-11-08 ENCOUNTER — Encounter (HOSPITAL_BASED_OUTPATIENT_CLINIC_OR_DEPARTMENT_OTHER): Payer: Self-pay | Admitting: Emergency Medicine

## 2014-11-08 ENCOUNTER — Emergency Department (HOSPITAL_BASED_OUTPATIENT_CLINIC_OR_DEPARTMENT_OTHER)
Admission: EM | Admit: 2014-11-08 | Discharge: 2014-11-08 | Disposition: A | Discharge status: Home / Self Care | Payer: Self-pay | Attending: Emergency Medicine | Admitting: Emergency Medicine

## 2014-11-08 DIAGNOSIS — Z87891 Personal history of nicotine dependence: Secondary | ICD-10-CM | POA: Insufficient documentation

## 2014-11-08 DIAGNOSIS — M25571 Pain in right ankle and joints of right foot: Secondary | ICD-10-CM | POA: Insufficient documentation

## 2014-11-08 DIAGNOSIS — Z8781 Personal history of (healed) traumatic fracture: Secondary | ICD-10-CM | POA: Insufficient documentation

## 2014-11-08 DIAGNOSIS — Z9889 Other specified postprocedural states: Secondary | ICD-10-CM | POA: Insufficient documentation

## 2014-11-08 MED ORDER — IBUPROFEN 800 MG PO TABS: 800.0000 mg | ORAL_TABLET | Freq: Once | ORAL | Status: DC

## 2014-11-08 MED ORDER — IBUPROFEN 800 MG PO TABS: 800.0000 mg | ORAL_TABLET | Freq: Three times a day (TID) | ORAL | Status: DC

## 2014-11-08 MED ORDER — KETOROLAC TROMETHAMINE 60 MG/2ML IM SOLN
60.0000 mg | Freq: Once | INTRAMUSCULAR | Status: DC
  Filled 2014-11-08: qty 2

## 2014-11-08 NOTE — ED Notes (Signed)
Rt foot pain

## 2014-11-08 NOTE — Discharge Instructions (Signed)
Ankle Pain Ankle pain is a common symptom. The bones, cartilage, tendons, and muscles of the ankle joint perform a lot of work each day. The ankle joint holds your body weight and allows you to move around. Ankle pain can occur on either side or back of 1 or both ankles. Ankle pain may be sharp and burning or dull and aching. There may be tenderness, stiffness, redness, or warmth around the ankle. The pain occurs more often when a person walks or puts pressure on the ankle. CAUSES  There are many reasons ankle pain can develop. It is important to work with your caregiver to identify the cause since many conditions can impact the bones, cartilage, muscles, and tendons. Causes for ankle pain include:  Injury, including a break (fracture), sprain, or strain often due to a fall, sports, or a high-impact activity.  Swelling (inflammation) of a tendon (tendonitis).  Achilles tendon rupture.  Ankle instability after repeated sprains and strains.  Poor foot alignment.  Pressure on a nerve (tarsal tunnel syndrome).  Arthritis in the ankle or the lining of the ankle.  Crystal formation in the ankle (gout or pseudogout). DIAGNOSIS  A diagnosis is based on your medical history, your symptoms, results of your physical exam, and results of diagnostic tests. Diagnostic tests may include X-ray exams or a computerized magnetic scan (magnetic resonance imaging, MRI). TREATMENT  Treatment will depend on the cause of your ankle pain and may include:  Keeping pressure off the ankle and limiting activities.  Using crutches or other walking support (a cane or brace).  Using rest, ice, compression, and elevation.  Participating in physical therapy or home exercises.  Wearing shoe inserts or special shoes.  Losing weight.  Taking medications to reduce pain or swelling or receiving an injection.  Undergoing surgery. HOME CARE INSTRUCTIONS   Only take over-the-counter or prescription medicines for  pain, discomfort, or fever as directed by your caregiver.  Put ice on the injured area.  Put ice in a plastic bag.  Place a towel between your skin and the bag.  Leave the ice on for 15-20 minutes at a time, 03-04 times a day.  Keep your leg raised (elevated) when possible to lessen swelling.  Avoid activities that cause ankle pain.  Follow specific exercises as directed by your caregiver.  Record how often you have ankle pain, the location of the pain, and what it feels like. This information may be helpful to you and your caregiver.  Ask your caregiver about returning to work or sports and whether you should drive.  Follow up with your caregiver for further examination, therapy, or testing as directed. SEEK MEDICAL CARE IF:   Pain or swelling continues or worsens beyond 1 week.  You have an oral temperature above 102 F (38.9 C).  You are feeling unwell or have chills.  You are having an increasingly difficult time with walking.  You have loss of sensation or other new symptoms.  You have questions or concerns. MAKE SURE YOU:   Understand these instructions.  Will watch your condition.  Will get help right away if you are not doing well or get worse.   This information is not intended to replace advice given to you by your health care provider. Make sure you discuss any questions you have with your health care provider.   Document Released: 06/24/2009 Document Revised: 03/29/2011 Document Reviewed: 08/06/2014 Elsevier Interactive Patient Education 2016 [employer]   Emergency Department Resource Guide 1) Find a  and Pay Out of Pocket °Although you won't have to find out who is covered by your insurance plan, it is a good idea to ask around and get recommendations. You will then need to call the office and see if the doctor you have chosen will accept you as a new patient and what types of options they offer for patients who are self-pay. Some doctors offer  discounts or will set up payment plans for their patients who do not have insurance, but you will need to ask so you aren't surprised when you get to your appointment. ° °2) Contact Your Local Health Department °Not all health departments have doctors that can see patients for sick visits, but many do, so it is worth a call to see if yours does. If you don't know where your local health department is, you can check in your phone book. The CDC also has a tool to help you locate your state's health department, and many state websites also have listings of all of their local health departments. ° °3) Find a Walk-in Clinic °If your illness is not likely to be very severe or complicated, you may want to try a walk in clinic. These are popping up all over the country in pharmacies, drugstores, and shopping centers. They're usually staffed by nurse practitioners or physician assistants that have been trained to treat common illnesses and complaints. They're usually fairly quick and inexpensive. However, if you have serious medical issues or chronic medical problems, these are probably not your best option. ° °No Primary Care Doctor: °- Call [facility] at  832-8000 - they can help you locate a primary care doctor that  accepts your insurance, provides certain services, etc. °- Physician Referral Service- 1-800-533-3463 ° °Chronic Pain Problems: °Organization         Address  Phone   Notes  °[facility]  (336) 297-2271 Patients need to be referred by their primary care doctor.  ° °Medication Assistance: °Organization         Address  Phone   Notes  °[place] [facility] [address] °[place], [place] [address] (336) 641-8030 --Must be a resident of [place] °-- Must have NO insurance coverage whatsoever (no Medicaid/ Medicare, etc.) °-- The pt. MUST have a primary care doctor that directs their care regularly and follows them in the community °  °[facility]   (866) 331-1348   °[employer]  (888) 892-1162   ° °Agencies that provide inexpensive medical care: °Organization         Address  Phone   Notes  °[facility]  (336) 832-8035   °[facility]    (336) 832-7272   °[facility] [address] °[place], [place] [address] (336) 832-4777   °[facility] [address], °[place] (336) 271-4999   °[facility]    (336) 373-0678   °[facility]    (336) 272-1050   °[facility] ° [address], [place] Phone:  (336) 832-4444, Fax:  (336) 832-4440 Hours of Operation:  9 am - 6 pm, M-F.  Also accepts Medicaid/Medicare and self-pay.  °[facility] ° [address],  Phone: (336) 832-3150, Fax: (336) [phone]. Hours of Operation:  8:30 am - 5:30 pm, M-F.  Also accepts Medicaid and self-pay.  °[facility] [place] [address], [place] Phone: (336) 878-6027   °  161-0960   [facility] [address] [place], [place] [phone], Ext. [phone] Mondays & Thursdays: 7-9 AM.  First 15 patients are seen on a first come, first serve basis.    Medicaid-accepting [facility] Providers:  Organization         Address  Phone   Notes  [facility] [address], [place] [phone] Also accepts self-pay patients.  [facility] [address] Laurell Josephs [place], [place]  [phone]   [facility] [address], [place] [phone]   [facility] [address] [place] [address] [place] [phone]   Renaye Rakers [address], [place]   ([phone] Only accepts [place] patients after they have their name applied to their card.   Self-Pay (no insurance) in [facility]:  Organization         Address  Phone   Notes  Sickle Cell Patients, [facility] [address] [place], [place] [phone]   [facility] [address] [place], [place] [phone]   [facility]  [address], [place] [phone]   [facility]/Dr. Osei-Bonsu  [address] [place] or [address], [place] [phone] Phone number for both [place] and [place] locations is the same.  [facility] [address] [place] [phone]   [facility] [address] [place] or [address] [phone]   [facility] [address] [place] [phone], phone; [phone], fax Sees patients 1st and 3rd Saturday of every month.  Must not qualify for public or private insurance (i.e. Medicaid, Medicare, [place] Health Choice, Veterans' Benefits)  Household income should be no more than 200% of the poverty level The clinic cannot treat you if you are pregnant or think you are pregnant  Sexually transmitted diseases are not treated at the clinic.    Dental Care: Organization         Address  Phone  Notes  [facility] [address] [place], [place] [phone] Accepts children up to age 70 who are enrolled in [place] or [place] Health Choice; pregnant women with a Medicaid card; and children who have applied for Medicaid or [place] Health Choice, but were declined, whose parents can pay a reduced fee at time of service.  [facility] [place]  [address], [place] [phone] Accepts children up to age 40 who are enrolled in [place] or [place] Health Choice; pregnant women with a Medicaid card; and children who have applied for Medicaid or [place] Health Choice, but were declined, whose parents can pay a reduced fee at time of service.  [facility]  [address] [place], [place]  ([phone] Patients are seen by appointment only. Walk-ins are not accepted. [facility] will see patients 16 years of age and older. Monday - Tuesday (8am-5pm) Most Wednesdays (8:30-5pm) $30 per visit, cash only  [facility]  [address], [facility] [phone] Patients are seen by appointment only. Walk-ins are not accepted. [facility] will see patients 22 years of age and older. One Wednesday Evening (Monthly: Volunteer Based).  $  cash only  °[facility]  ([phone]) [phone] for adults; Children under age 4, call Graduate Pediatric Dentistry at ([phone]. Children aged 4-14, please call [phone]) [phone] to request a pediatric application. ° Dental services are provided in all areas of dental care including fillings, crowns and bridges, complete and partial dentures, implants, gum treatment, root canals, and extractions. Preventive care is also provided. Treatment is provided to both adults and children. °Patients are selected via a lottery and there is often a waiting list. °  °[facility] [address], °[place] ° (336) 763-8833 [email or web address] °  °[facility] [address], [place], [place] (336)723-1848, Ext. 123 Second and Fourth Thursday of each month, opens at 6:30 AM; Clinic ends at 9 AM.  Patients are seen on a first-come first-served basis, and a limited number are seen during each clinic.  ° °[facility] ° [address], [place], [place] (336) [phone]   Eligibility Requirements °You must have lived in [place], [place], or [place] counties for at least the last three months. °  You cannot be eligible for state or federal sponsored healthcare insurance, including Veterans Administration, Medicaid, or Medicare. °  You generally cannot be eligible for healthcare insurance through your employer.  °  How to apply: °Eligibility screenings are held every Tuesday and Wednesday  afternoon from 1:00 pm until 4:00 pm. You do not need an appointment for the interview!  °[facility] [address], [place], [place] [phone]   °[facility]  336-342-8273   °[place]  336-703-3100   °[facility]  336-570-6415   ° °Behavioral Health Resources in the Community: °Intensive Outpatient Programs °Organization         Address  Phone  Notes  °[facility] [address], [place], [place] [phone]   °[facility] [address], [place], [place] 336-832-9800   °[facility] [address], [place], [place] ° 336-882-2125   °[facility] [address],  °[place], [place] 1-800-853-5163 or 336-641-4981   °Substance Abuse Resources °Organization         Address  Phone  Notes  °[facility]  336-882-2125   °[facility]  336-784-9470   °[facility]  336-285-9073   °[facility]  336-845-3988   °Residential & Outpatient Substance Abuse Program  1-800-659-3381   °Psychological Services °Organization         Address  Phone  Notes  °[facility]  336- 832-9600   °[facility]  336- 378-7881   °[facility] [address], [place] 1-800-853-5163 or 336-641-4981   ° °Mobile Crisis Teams °Organization         Address  Phone  Notes  °[facility], Mobile Crisis Care Unit  1-877-626-1772   °Assertive °[facility] ° [address] [place], [place] 336-834-9664   °Sharon DeEsch [address] °[place] 336-554-5454   ° °Self-Help/Support Groups °Organization         Address  Phone             Notes  °[facility] - variety of support groups  336- 373-1402 Call for more information  °Narcotics Anonymous (NA), [facility] [address], °[place]  2 meetings at this location  ° °Residential Treatment  Programs °Organization           Address  Phone  Notes  °[facility] [address],    °[place]  1-866-801-8205   °[facility] ° [address], [place], [place] [phone]   °[facility] [address], [place] 336-845-3988 Admissions: 8am-3pm M-F  °[facility] [address]    °[place], [place] 336-841-1104   °[facility] [address], [place], [place] 336-379-7146   °[facility] [address]  °[place], [place] 336-285-9073   °[facility] Intensive Outpatient [address], [place], [place] 336-852-3033   °[facility] [address]  °[place], [place] 1-877-615-2722 or 336-784-9470   °[facility] [address] [place], [place] 336-227-7417 Accepts Medicaid  °[facility] [address]  °[place] 1-800-659-3381 Substance Abuse/Addiction Treatment  ° °[place] [facility]al Health Resources °Organization         Address  Phone  Notes  °[facility]  (888) 581-9988   °Julie Brannon, PhD [address] [place], [place]   (336) 349-5553 or (336) 951-0000   °[facility]   [address] °[place], [place] (336) 349-4454   °[facility] [address], [place], [place] (336) 342-8316 Insurance/Medicaid/sponsorship through [facility]  °[facility] [address]                                    [place], [place] (336) 342-8316 Therapy/tele-psych/case  °[facility] [address]  ° [place],  (336) 349-2233    °Dr. Arfeen  (336) 349-4544   °[facility] [place]  [employer] [place] [facility] 1) [address], [place] °2) [address], [place] °3)  [address], [place] (336) 349-3220 °(336) 342-7768 ° °(336) 342-8140   °[place] Child Abuse [facility] (336) 342-1394 or (336) 342-3537 (After Hours)    ° ° ° °

## 2014-11-08 NOTE — ED Notes (Signed)
Ankle brace applied to rt ankle . Instructed the patient on how to apply and the need to wear while ambulating. Pt verbalized understanding.

## 2014-11-08 NOTE — ED Provider Notes (Signed)
CSN: [id number]     Arrival date & time 11/08/14  1139 History   First MD Initiated Contact with Patient 11/08/14 1203     Chief Complaint  Patient presents with  . Foot Pain     (Consider location/radiation/quality/duration/timing/severity/associated sxs/prior Treatment) Patient is a 54 y.o. male presenting with lower extremity pain. The history is provided by the patient.  Foot Pain This is a chronic problem. The current episode started 1 to 4 weeks ago. The problem occurs constantly. The problem has been gradually worsening (patient states he is on his feet all day with his job). Associated symptoms include joint swelling and numbness. Pertinent negatives include no abdominal pain, chest pain, chills, fever, nausea, vomiting or weakness. The symptoms are aggravated by standing and walking. Treatments tried: oxycodone. Improvement on treatment: patient states he thinks it's too strong, and doesn't like the way it makes him feel.   No injury/trauma.  Patient is ambulatory.  Patient with history of right ankle fracture and surgical repair in July of 2015.  Past Medical History  Diagnosis Date  . Allergy    Past Surgical History  Procedure Laterality Date  . Fracture surgery  07/18/2013    R ankle surgery; [facility]   No family history on file. Social History  Substance Use Topics  . Smoking status: Former Games developermoker  . Smokeless tobacco: None  . Alcohol Use: No    Review of Systems  Constitutional: Negative for fever and chills.  Cardiovascular: Negative for chest pain.  Gastrointestinal: Negative for nausea, vomiting and abdominal pain.  Musculoskeletal: Positive for joint swelling. Negative for gait problem.  Neurological: Positive for numbness. Negative for weakness.      Allergies  Other  Home Medications   Prior to Admission medications   Medication Sig Start Date End Date Taking? Authorizing Provider  ibuprofen (ADVIL,MOTRIN) 800 MG tablet Take 1 tablet (800 mg total)  by mouth 3 (three) times daily. 11/08/14   Croy Drumwright, PA-C   BP 139/89 mmHg  Pulse 80  Temp(Src) 98.3 F (36.8 C) (Oral)  Resp 18  Ht 5\' 10"  (1.778 m)  Wt 205 lb (92.987 kg)  BMI 29.41 kg/m2  SpO2 99% Physical Exam  Constitutional: He is oriented to person, place, and time. He appears well-developed and well-nourished.  HENT:  Head: Atraumatic.  Eyes: Conjunctivae are normal. No scleral icterus.  Neck: No tracheal deviation present.  Cardiovascular:  Pedal pulses 2+ in right foot.  Capillary refill less than 3 seconds.   Pulmonary/Chest: Effort normal. No respiratory distress.  Musculoskeletal: Normal range of motion.  Right ankle joint is minimally TTP.  No deformities.  Mild swelling.  Neurological: He is alert and oriented to person, place, and time.  Sensation intact in all 5 digits in right lower extremity.  Strength 5/5 in plantar dorsiflexion and extension.  Skin: Skin is warm and dry.  Well healed scar above right ankle.  No erythema, warmth, induration, or fluctuance.  Psychiatric: He has a normal mood and affect. His behavior is normal.    ED Course  Procedures (including critical care time) Labs Review Labs Reviewed - No data to display  Imaging Review No results found. I have personally reviewed and evaluated these images and lab results as part of my medical decision-making.   EKG Interpretation None      MDM   Final diagnoses:  Right ankle pain    Right ankle pain.  No injury/trauma.  VSS.  Neurovascularly intact.  Will apply ankle brace.  Follow up with his orthopedist or PCP.  Med resource given.    Cheri Fowler, PA-C 11/08/14 1316  Nelva Nay, MD 11/08/14 [phone]

## 2014-11-27 ENCOUNTER — Encounter (HOSPITAL_BASED_OUTPATIENT_CLINIC_OR_DEPARTMENT_OTHER): Payer: Self-pay

## 2014-11-27 ENCOUNTER — Emergency Department (HOSPITAL_BASED_OUTPATIENT_CLINIC_OR_DEPARTMENT_OTHER)
Admission: EM | Admit: 2014-11-27 | Discharge: 2014-11-27 | Disposition: A | Discharge status: Home / Self Care | Payer: PRIVATE HEALTH INSURANCE | Attending: Emergency Medicine | Admitting: Emergency Medicine

## 2014-11-27 DIAGNOSIS — M25571 Pain in right ankle and joints of right foot: Secondary | ICD-10-CM | POA: Diagnosis present

## 2014-11-27 DIAGNOSIS — Z791 Long term (current) use of non-steroidal anti-inflammatories (NSAID): Secondary | ICD-10-CM | POA: Insufficient documentation

## 2014-11-27 DIAGNOSIS — Z87891 Personal history of nicotine dependence: Secondary | ICD-10-CM | POA: Diagnosis not present

## 2014-11-27 DIAGNOSIS — M79671 Pain in right foot: Secondary | ICD-10-CM | POA: Diagnosis not present

## 2014-11-27 DIAGNOSIS — M25671 Stiffness of right ankle, not elsewhere classified: Secondary | ICD-10-CM

## 2014-11-27 DIAGNOSIS — G8929 Other chronic pain: Secondary | ICD-10-CM | POA: Diagnosis not present

## 2014-11-27 MED ORDER — DICLOFENAC SODIUM 1 % TD GEL: 2.0000 g | Freq: Four times a day (QID) | TRANSDERMAL | Status: DC
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
## 2014-11-27 NOTE — Discharge Instructions (Signed)
1. Medications: alternate naprosyn and tylenol for pain control, use full tear and gel, usual home medications 2. Treatment: rest, ice, elevate and use brace, drink plenty of fluids, gentle stretching 3. Follow Up: Please followup with orthopedics as directed for discussion of your diagnoses and further evaluation after today's visit; if you do not have a primary care doctor use the resource guide provided to find one; Please return to the ER for worsening symptoms or other concerns
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
## 2015-01-14 ENCOUNTER — Emergency Department (HOSPITAL_BASED_OUTPATIENT_CLINIC_OR_DEPARTMENT_OTHER)
Admission: EM | Admit: 2015-01-14 | Discharge: 2015-01-14 | Disposition: A | Discharge status: Home / Self Care | Payer: PRIVATE HEALTH INSURANCE | Attending: Emergency Medicine | Admitting: Emergency Medicine

## 2015-01-14 ENCOUNTER — Encounter (HOSPITAL_BASED_OUTPATIENT_CLINIC_OR_DEPARTMENT_OTHER): Payer: Self-pay

## 2015-01-14 DIAGNOSIS — M79602 Pain in left arm: Secondary | ICD-10-CM | POA: Insufficient documentation

## 2015-01-14 DIAGNOSIS — R202 Paresthesia of skin: Secondary | ICD-10-CM | POA: Insufficient documentation

## 2015-01-14 DIAGNOSIS — Z87891 Personal history of nicotine dependence: Secondary | ICD-10-CM | POA: Diagnosis not present

## 2015-01-14 DIAGNOSIS — Z791 Long term (current) use of non-steroidal anti-inflammatories (NSAID): Secondary | ICD-10-CM | POA: Diagnosis not present

## 2015-01-14 DIAGNOSIS — M5412 Radiculopathy, cervical region: Secondary | ICD-10-CM

## 2015-01-14 DIAGNOSIS — R200 Anesthesia of skin: Secondary | ICD-10-CM | POA: Diagnosis not present

## 2015-01-14 DIAGNOSIS — M542 Cervicalgia: Secondary | ICD-10-CM | POA: Diagnosis present

## 2015-01-14 MED ORDER — GABAPENTIN 100 MG PO CAPS: 100.0000 mg | ORAL_CAPSULE | Freq: Three times a day (TID) | ORAL | Status: DC

## 2015-01-14 MED ORDER — OXYCODONE-ACETAMINOPHEN 5-325 MG PO TABS: 1.0000 | ORAL_TABLET | ORAL | Status: DC | PRN

## 2015-01-14 MED ORDER — HYDROCODONE-ACETAMINOPHEN 5-325 MG PO TABS: 2.0000 | ORAL_TABLET | ORAL | Status: DC | PRN

## 2015-01-14 MED ORDER — PREDNISONE 20 MG PO TABS: 40.0000 mg | ORAL_TABLET | Freq: Every day | ORAL | Status: DC
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
## 2015-01-14 NOTE — Discharge Instructions (Signed)
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
## 2015-02-03 ENCOUNTER — Encounter (HOSPITAL_BASED_OUTPATIENT_CLINIC_OR_DEPARTMENT_OTHER): Payer: Self-pay | Admitting: *Deleted

## 2015-02-03 ENCOUNTER — Emergency Department (HOSPITAL_BASED_OUTPATIENT_CLINIC_OR_DEPARTMENT_OTHER): Payer: PRIVATE HEALTH INSURANCE

## 2015-02-03 ENCOUNTER — Emergency Department (HOSPITAL_BASED_OUTPATIENT_CLINIC_OR_DEPARTMENT_OTHER)
Admission: EM | Admit: 2015-02-03 | Discharge: 2015-02-03 | Disposition: A | Discharge status: Home / Self Care | Payer: PRIVATE HEALTH INSURANCE | Attending: Emergency Medicine | Admitting: Emergency Medicine

## 2015-02-03 DIAGNOSIS — Z79899 Other long term (current) drug therapy: Secondary | ICD-10-CM | POA: Diagnosis not present

## 2015-02-03 DIAGNOSIS — M542 Cervicalgia: Secondary | ICD-10-CM | POA: Insufficient documentation

## 2015-02-03 DIAGNOSIS — R200 Anesthesia of skin: Secondary | ICD-10-CM | POA: Insufficient documentation

## 2015-02-03 DIAGNOSIS — Z87891 Personal history of nicotine dependence: Secondary | ICD-10-CM | POA: Diagnosis not present

## 2015-02-03 DIAGNOSIS — Z7952 Long term (current) use of systemic steroids: Secondary | ICD-10-CM | POA: Diagnosis not present

## 2015-02-03 DIAGNOSIS — M546 Pain in thoracic spine: Secondary | ICD-10-CM | POA: Insufficient documentation

## 2015-02-03 MED ORDER — CYCLOBENZAPRINE HCL 10 MG PO TABS: 10.0000 mg | ORAL_TABLET | Freq: Two times a day (BID) | ORAL | Status: DC | PRN

## 2015-02-03 MED ORDER — KETOROLAC TROMETHAMINE 60 MG/2ML IM SOLN
60.0000 mg | Freq: Once | INTRAMUSCULAR | Status: AC
  Administered 2015-02-03: 60 mg via INTRAMUSCULAR
  Filled 2015-02-03: qty 2

## 2015-02-03 MED FILL — CYCLOBENZAPRINE 10 MG TAB: 10 | 10 days supply | Qty: 20 | Fill #0

## 2015-02-03 NOTE — Discharge Instructions (Signed)
Take your medications as prescribed. I also recommend continuing to take 800mg  Ibuprofen 3 times a day. Call the neurosurgery clinic listed above to schedule a follow up appointment with Dr. Danielle DessElsner for further management. Please return to the Emergency Department if symptoms worsen or new onset of fever, neck stiffness, numbness, tingling, weakness.    Emergency Department Resource Guide 1) Find a Doctor and Pay Out of Pocket Although you won't have to find out who is covered by your insurance plan, it is a good idea to ask around and get recommendations. You will then need to call the office and see if the doctor you have chosen will accept you as a new patient and what types of options they offer for patients who are self-pay. Some doctors offer discounts or will set up payment plans for their patients who do not have insurance, but you will need to ask so you aren't surprised when you get to your appointment.  2) Contact Your Local Health Department Not all health departments have doctors that can see patients for sick visits, but many do, so it is worth a call to see if yours does. If you don't know where your local health department is, you can check in your phone book. The CDC also has a tool to help you locate your state's health department, and many state websites also have listings of all of their local health departments.  3) Find a Walk-in Clinic If your illness is not likely to be very severe or complicated, you may want to try a walk in clinic. These are popping up all over the country in pharmacies, drugstores, and shopping centers. They're usually staffed by nurse practitioners or physician assistants that have been trained to treat common illnesses and complaints. They're usually fairly quick and inexpensive. However, if you have serious medical issues or chronic medical problems, these are probably not your best option.  No Primary Care Doctor: - Call Health Connect at  [phone] -  they can help you locate a primary care doctor that  accepts your insurance, provides certain services, etc. - Physician Referral Service- [phone]  Chronic Pain Problems: Organization         Address  Phone   Notes  [facility]  [phone] Patients need to be referred by their primary care doctor.   Medication Assistance: Organization         Address  Phone   Notes  [facility] Medication [facility] [address] [place], [place] [address] [phone] --Must be a resident of [facility] [place] -- Must have NO insurance coverage whatsoever (no Medicaid/ Medicare, etc.) -- The pt. MUST have a primary care doctor that directs their care regularly and follows them in the community   MedAssist  [phone]   [employer]  [phone]    Agencies that provide inexpensive medical care: Organization         Address  Phone   Notes  [facility]  [phone]   [facility]    [phone]   [facility] [address] [place], [place] [address] [phone]   [facility] [address], [place] [phone]   Planned Parenthood    [phone]   [facility]    [phone]   [facility] and [facility]  [address], [place] Phone:  [phone], Fax:  [phone]  Hours of Operation:  9 am - 6 pm, M-F.  Also accepts Medicaid/Medicare and self-pay.  [facility] for [place], [address], [place] Phone: [phone], Fax: [phone]. Hours of Operation:  8:30 am - 5:30 pm, M-F.  Also accepts Medicaid and self-pay.  [facility] [address] [place] Phone: [phone]   [place], [place], [place] [phone], Ext. [phone] Mondays & Thursdays: 7-9 AM.  First 15 patients are seen on a first come, first serve basis.    [place] Providers:  Organization         Address  Phone   Notes  [facility] [address], [place] [phone] Also accepts self-pay patients.  [facility] [id number] [place], [place]  [phone]   [place], [address], [place] [phone]   [facility] [address] [place] [phone]   Lucianne Lei [address], [place]   [phone] Only accepts [place] Access [place] patients after they have their name applied to their card.   Self-Pay (no insurance) in [facility]:  Organization         Address  Phone   Notes  Sickle Cell Patients, [facility] Internal Medicine [place] [phone]   [facility]gent Care [place] [phone]   [facility] [place]  [place], [place], [place] [phone]   [facility]/Dr. Osei-Bonsu  [address] [place] or [address], [place] [phone] Phone number for both [place] and [employer] locations is the same.  Urgent Medical and [facility] [address], [place] [phone]   [facility] [address], [place] or [address] [phone]   [facility] [address], [place] [phone], phone; [phone], fax Sees patients 1st and 3rd Saturday of every month.  Must not qualify for public or private insurance (i.e. Medicaid, Medicare, [place] Health Choice, Veterans' Benefits)  Household income should be no more than 200% of the poverty level The clinic cannot treat you if you are pregnant or think you are pregnant  Sexually transmitted diseases are not treated at the clinic.    Dental Care: Organization         Address  Phone  Notes  [facility] Department of [facility] [place] [phone] Accepts children up to age 43 who are enrolled in [place] or [place]; pregnant women with a Medicaid card; and children who have applied for Medicaid or [place] Health Choice, but were declined, whose parents can pay a reduced fee at time of service.  [facility] Department of [facility]  [address], [place] [phone] Accepts children up to age 45 who are enrolled in [place] or [place]; pregnant women with a Medicaid card; and children who have applied for Medicaid or [place] Health Choice, but were declined, whose parents can pay a reduced fee at time of service.  [place] Adult Dental Access PROGRAM  [place] [phone] Patients are seen by appointment only. Walk-ins are not accepted. [place] will  see patients 42 years of age and older. Monday - Tuesday (8am-5pm) Most Wednesdays (8:30-5pm) $30 per visit, cash only  [facility] Adult Dental Access PROGRAM  [address], [facility] [phone] Patients are seen by appointment only. Walk-ins are not accepted. [place] will see patients 35 years of age and older. One Wednesday Evening (Monthly: Volunteer Based).  $30 per visit, cash only  [place]  [phone] for adults; Children under age 10, call Graduate Pediatric Dentistry at [phone]. Children aged 32-14, please call [phone] to request a pediatric application.  Dental services are provided in all areas of dental care including fillings, crowns and bridges, complete and partial dentures, implants, gum treatment, root canals, and extractions. Preventive care is also provided. Treatment is provided to both adults and children. Patients are selected via a lottery and there is often a waiting list.   [facility] [address], [place]  [phone] [email or web address]   Rescue  [facility] [address] [place], [place] [phone], Ext. [phone] Second and Fourth Thursday of each month, opens at 6:30 AM; Clinic ends at 9 AM.  Patients are seen on a first-come first-served basis, and a limited number are seen during each clinic.   [facility]  [address] Hillard Danker [place], [place] [phone]   Eligibility Requirements You must have lived in [place], [place], or [place] counties for at least the last three months.   You cannot be eligible for state or federal sponsored [employer], including [employer], [place], or [employer].   You generally cannot be eligible for healthcare insurance through your employer.    How to apply: Eligibility screenings are held every Tuesday and Wednesday afternoon from 1:00 pm until 4:00 pm. You do not need an appointment for the interview!  [facility] [address], [place], [place]   [place]  [place] Department  [place]  [phone]    Behavioral Health Resources in the Community: Intensive Outpatient Programs Organization         Address  Phone  Notes  [place] [facility]. [address], [place], [place] [phone]   [address] [facility] Outpatient [address], [place], [place]   ADS: Alcohol & Drug Svcs [address] [place], [place]   [place] [address],  [place], [place] or [phone]   Substance Abuse Resources Organization         Address  Phone  Notes  Alcohol and Drug Services  [phone]   [place]  [phone]   [facility]   Chinita Pester  [phone]   Residential & Outpatient Substance Abuse Program  [phone]   Psychological Services Organization         Address  Phone  Notes  [facility]   [place]  [phone]   [place] [address], [place] or [phone]    Mobile Crisis Teams Organization         Address  Phone  Notes  [facility]rapeutic Alternatives, Mobile Crisis Care Unit  [phone]   Assertive Psychotherapeutic Services  [address] [place], [place]   [facility] [address] [place] [phone]    Self-Help/Support Groups Organization  Address  Phone             Notes  Mental Health Assoc. of [place] - variety of support groups  336- I7437963 Call for more information  Narcotics Anonymous (NA), [facility] [address], [employer] [place]  2 meetings at this location   Statistician         Address  Phone  Notes  [facility] [address],    [place]  [phone]   [facility]  [address], [place] [address], [place], [place] [phone]   [facility] [address] [place], [place] [phone] Admissions: 8am-3pm M-F  [facility] [address]    [place], [place] [phone]   [facility] [address] [place], [place], [place] [phone]   [facility] [address]  [place], [place] [phone]   [facility] - Intensive Outpatient [address], [place], [place] [phone]   [facility] (Addiction Recovery Care Assoc.) [address] [place].,  [place], [place] [phone] or [phone]   [facility] Services (RTS) [address], [place], [place] [phone] Accepts Medicaid  [facility] [address],  [place] [phone] Substance Abuse/Addiction Treatment   [facility] Organization         Address  Phone  Notes  [facility]  ([phone]   Angie Fava, PhD [address] Ervin Knack [place], [place]   ([phone] or  [phone]   [facility]al   [address] [place], [place] [phone]   [facility] [address], [place], [place] [phone] Insurance/Medicaid/sponsorship through [facility] and Families [address]                                    [place], [place] [phone] Therapy/tele-psych/case  [facility] [address], [place] [phone]    Dr. Lolly Mustache  [phone]   [facility]  United Way [facility] Dept. 1) [address], [place] 2) [address] [place] 3)  [address] [place] [address], [place] [phone]  [phone]   [facility] Child Abuse Hotline [phone] or [phone] (After Hours)

## 2015-02-03 NOTE — ED Notes (Signed)
Neck pain x 2 months. He was seen here for same and given medication but pain has continued.
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
## 2015-02-03 NOTE — ED Notes (Signed)
PA at bedside.

## 2015-02-03 NOTE — ED Notes (Signed)
Patient transported to X-ray 

## 2015-05-27 ENCOUNTER — Encounter (HOSPITAL_BASED_OUTPATIENT_CLINIC_OR_DEPARTMENT_OTHER): Payer: Self-pay | Admitting: *Deleted

## 2015-05-27 ENCOUNTER — Emergency Department (HOSPITAL_BASED_OUTPATIENT_CLINIC_OR_DEPARTMENT_OTHER)
Admission: EM | Admit: 2015-05-27 | Discharge: 2015-05-27 | Disposition: A | Discharge status: Home / Self Care | Payer: PRIVATE HEALTH INSURANCE | Attending: Emergency Medicine | Admitting: Emergency Medicine

## 2015-05-27 DIAGNOSIS — M799 Soft tissue disorder, unspecified: Secondary | ICD-10-CM | POA: Insufficient documentation

## 2015-05-27 DIAGNOSIS — M542 Cervicalgia: Secondary | ICD-10-CM

## 2015-05-27 DIAGNOSIS — M7989 Other specified soft tissue disorders: Secondary | ICD-10-CM

## 2015-05-27 DIAGNOSIS — Z87891 Personal history of nicotine dependence: Secondary | ICD-10-CM | POA: Insufficient documentation

## 2015-05-27 NOTE — ED Notes (Signed)
MD in room

## 2015-05-27 NOTE — ED Provider Notes (Signed)
CSN: [id number]     Arrival date & time 05/27/15  1210 History   First MD Initiated Contact with Patient 05/27/15 1347     Chief Complaint  Patient presents with  . Neck Pain     (Consider location/radiation/quality/duration/timing/severity/associated sxs/prior Treatment) Patient is a 55 y.o. male presenting with neck pain. The history is provided by the patient.  Neck Pain Associated symptoms: numbness   Associated symptoms: no chest pain and no fever   Patient with about a 3 month history of left-sided neck pain and occasional arm pain and numbness in the left fingers. Followed by chiropractor for this. That sounds like it's probably consistent with a radiculopathy. Chiropractor did do regular x-rays without any significant abnormalities. Patient noticed a lump on the back of his neck at the base of the neck just in the past few days. He is here today to find out what that is. Patient also with concern for left ear wax. No ear pain. Patient without any other pacific symptoms no fevers no headaches no nausea or vomiting no chest pain no shortness of breath no abdominal pain. Currently no weakness to the left arm.  Past Medical History  Diagnosis Date  . Allergy    Past Surgical History  Procedure Laterality Date  . Fracture surgery  07/18/2013    R ankle surgery; [facility]   No family history on file. Social History  Substance Use Topics  . Smoking status: Former Games developer  . Smokeless tobacco: None  . Alcohol Use: No    Review of Systems  Constitutional: Negative for fever.  HENT: Negative for congestion and trouble swallowing.   Eyes: Negative for visual disturbance.  Respiratory: Negative for shortness of breath.   Cardiovascular: Negative for chest pain.  Gastrointestinal: Negative for nausea, vomiting and abdominal pain.  Genitourinary: Negative for dysuria.  Musculoskeletal: Positive for neck pain.  Skin: Negative for rash.  Neurological: Positive for numbness.   Hematological: Does not bruise/bleed easily.  Psychiatric/Behavioral: Negative for confusion.      Allergies  Flagyl; Other; Oxycodone; and Tramadol  Home Medications   Prior to Admission medications   Medication Sig Start Date End Date Taking? Authorizing Provider  cyclobenzaprine (FLEXERIL) 10 MG tablet Take 1 tablet (10 mg total) by mouth 2 (two) times daily as needed for muscle spasms. 02/03/15   Barrett Henle, PA-C  diclofenac sodium (VOLTAREN) 1 % GEL Apply 2 g topically 4 (four) times daily. 11/27/14   Hannah Muthersbaugh, PA-C  gabapentin (NEURONTIN) 100 MG capsule Take 1 capsule (100 mg total) by mouth 3 (three) times daily. 01/14/15   Raeford Razor, MD  HYDROcodone-acetaminophen (NORCO/VICODIN) 5-325 MG tablet Take 2 tablets by mouth every 4 (four) hours as needed. 01/14/15   Raeford Razor, MD  ibuprofen (ADVIL,MOTRIN) 800 MG tablet Take 1 tablet (800 mg total) by mouth 3 (three) times daily. 11/08/14   Cheri Fowler, PA-C  predniSONE (DELTASONE) 20 MG tablet Take 2 tablets (40 mg total) by mouth daily. 01/14/15   Raeford Razor, MD   BP 151/98 mmHg  Pulse 74  Temp(Src) 98.1 F (36.7 C) (Oral)  Resp 18  Ht  (1.753 m)  Wt 92.987 kg  BMI 30.26 kg/m2  SpO2 96% Physical Exam  Constitutional: He is oriented to person, place, and time. He appears well-developed and well-nourished. No distress.  HENT:  Head: Normocephalic and atraumatic.  Right Ear: External ear normal.  Left Ear: External ear normal.  Mouth/Throat: Oropharynx is clear and moist.  Left  ear canal partially occluded with ear wax.  Eyes: Conjunctivae and EOM are normal. Pupils are equal, round, and reactive to light.  Neck: Normal range of motion. Neck supple.  Posterior neck at the base of the neck with about a 4 x 4 centimeter area of soft tissue mass mobile not erythematous nontender suggestive of a lipoma.  Cardiovascular: Normal rate, regular rhythm and normal heart sounds.   No murmur  heard. Pulmonary/Chest: Effort normal and breath sounds normal. No respiratory distress.  Abdominal: Soft. Bowel sounds are normal. There is no tenderness.  Musculoskeletal: Normal range of motion. He exhibits no edema or tenderness.  Neurological: He is alert and oriented to person, place, and time. No cranial nerve deficit. He exhibits normal muscle tone. Coordination normal.  Skin: Skin is warm. No rash noted.  Nursing note and vitals reviewed.   ED Course  Procedures (including critical care time) Labs Review Labs Reviewed - No data to display  Imaging Review No results found. I have personally reviewed and evaluated these images and lab results as part of my medical decision-making.   EKG Interpretation None      MDM   Final diagnoses:  Neck pain  Soft tissue mass   Patient with a soft tissue mass on the base of the neck posteriorly that is mobile suggestive of a lipoma. However sometimes endocrine thyroid problems can cause fat deposits in that area. Patient also clearly with symptoms for about 3 months consistent with a left-sided radiculopathy from the neck. Occasionally has numbness and tingling in the fingers predominantly last 2 fingers. Been seen by chiropractor for this. Has had x-rays of the neck that have been negative. No severe pain associated with radiculopathy at this point in time.  Counseled the patient that he really needs a primary care doctor or to use an urgent care as a primary care doctor would recommend thyroid studies good lab screening and an MRI of the neck to evaluate the nerve roots and also that would give some information about the soft tissue mass which I think is really a lipoma.  In addition patient also concerned with ear wax buildup. Left ear canal with partial occlusion due to ear wax. Right ear canal with minimal ear wax. No terrible impaction. Recommended 2 drops of olive oil into the left ear each night.    Patient nontoxic no acute  distress.    Vanetta MuldersScott Kazzandra Desaulniers, MD 05/27/15 [phone]
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
## 2015-05-27 NOTE — Discharge Instructions (Signed)
As we discussed. Would recommend MRI of the neck. Would recommend thyroid studies. Would recommend getting a primary care doctor or using one of the local urgent cares in the meantime. Suspect that the soft tissue mass on the neck is probably a lipoma. Further evaluation needed.

## 2016-01-20 ENCOUNTER — Encounter (HOSPITAL_BASED_OUTPATIENT_CLINIC_OR_DEPARTMENT_OTHER): Payer: Self-pay | Admitting: *Deleted

## 2016-01-20 ENCOUNTER — Emergency Department (HOSPITAL_BASED_OUTPATIENT_CLINIC_OR_DEPARTMENT_OTHER)
Admission: EM | Admit: 2016-01-20 | Discharge: 2016-01-20 | Disposition: A | Discharge status: Home / Self Care | Payer: PRIVATE HEALTH INSURANCE | Attending: Physician Assistant | Admitting: Physician Assistant

## 2016-01-20 ENCOUNTER — Emergency Department (HOSPITAL_BASED_OUTPATIENT_CLINIC_OR_DEPARTMENT_OTHER): Payer: PRIVATE HEALTH INSURANCE

## 2016-01-20 DIAGNOSIS — J40 Bronchitis, not specified as acute or chronic: Secondary | ICD-10-CM | POA: Insufficient documentation

## 2016-01-20 DIAGNOSIS — Z79899 Other long term (current) drug therapy: Secondary | ICD-10-CM | POA: Insufficient documentation

## 2016-01-20 DIAGNOSIS — Z87891 Personal history of nicotine dependence: Secondary | ICD-10-CM | POA: Insufficient documentation

## 2016-01-20 LAB — D-DIMER, QUANTITATIVE: D-Dimer, Quant: <0.27 ug{FEU}/mL (ref 0.00–0.50)

## 2016-01-20 MED ORDER — ALBUTEROL SULFATE HFA 108 (90 BASE) MCG/ACT IN AERS: 1.0000 | INHALATION_SPRAY | Freq: Four times a day (QID) | RESPIRATORY_TRACT | 0 refills | Status: DC | PRN

## 2016-01-20 MED ORDER — HYDROCOD POLST-CPM POLST ER 10-8 MG/5ML PO SUER: 5.0000 mL | Freq: Two times a day (BID) | ORAL | 0 refills | Status: DC | PRN

## 2016-01-20 MED ORDER — PREDNISONE 20 MG PO TABS: 40.0000 mg | ORAL_TABLET | Freq: Every day | ORAL | 0 refills | Status: DC

## 2016-01-20 MED ORDER — IPRATROPIUM-ALBUTEROL 0.5-2.5 (3) MG/3ML IN SOLN
3.0000 mL | Freq: Once | RESPIRATORY_TRACT | Status: AC
  Administered 2016-01-20: 3 mL via RESPIRATORY_TRACT
  Filled 2016-01-20: qty 3

## 2016-01-20 MED ORDER — PREDNISONE 10 MG PO TABS
60.0000 mg | ORAL_TABLET | Freq: Once | ORAL | Status: AC
  Administered 2016-01-20: 22:00:00 60 mg via ORAL
  Filled 2016-01-20: qty 1

## 2016-01-20 MED ORDER — BENZONATATE 100 MG PO CAPS
100.0000 mg | ORAL_CAPSULE | Freq: Once | ORAL | Status: AC
  Administered 2016-01-20: 100 mg via ORAL
  Filled 2016-01-20: qty 1

## 2016-01-20 NOTE — ED Provider Notes (Signed)
[facility] Provider Note   CSN: [id number] Arrival date & time: 01/20/16  1729  By signing my name below, I, Linna Darner, attest that this documentation has been prepared under the direction and in the presence of Demetrios Loll, PA-C. Electronically Signed: Linna Darner, Scribe. 01/20/2016. 9:50 PM.  History   Chief Complaint Chief Complaint  Patient presents with  . Cough    The history is provided by the patient. No language interpreter was used.    HPI Comments: Justin Rios is a 56 y.o. male who presents to the Emergency Department complaining of a persistent cough for about 5 weeks. He states his cough is mostly dry but occasionally productive upon waking in the morning. He notes he has occasionally coughed up small amounts of blood since onset. He reports associated rhinorrhea and congestion and states his throat itches. He has tried various OTC medications with no improvement of his symptoms. He notes his blood pressure was elevated in triage tonight but denies h/o HTN. He states he smokes cigarettes occasionally but reports he is not a regular smoker. No h/o lung problems. He states he has several sick contacts with similar symptoms. No recent immobilizations. He denies fever, chills, sore throat, CP, SOB, leg pain, or any other associated symptoms.  Past Medical History:  Diagnosis Date  . Allergy     There are no active problems to display for this patient.   Past Surgical History:  Procedure Laterality Date  . FRACTURE SURGERY  07/18/2013   R ankle surgery; [facility]       Home Medications    Prior to Admission medications   Medication Sig Start Date End Date Taking? Authorizing Provider  cyclobenzaprine (FLEXERIL) 10 MG tablet Take 1 tablet (10 mg total) by mouth 2 (two) times daily as needed for muscle spasms. 02/03/15   Barrett Henle, PA-C  diclofenac sodium (VOLTAREN) 1 % GEL Apply 2 g topically 4 (four) times daily. 11/27/14    Hannah Muthersbaugh, PA-C  gabapentin (NEURONTIN) 100 MG capsule Take 1 capsule (100 mg total) by mouth 3 (three) times daily. 01/14/15   Raeford Razor, MD  HYDROcodone-acetaminophen (NORCO/VICODIN) 5-325 MG tablet Take 2 tablets by mouth every 4 (four) hours as needed. 01/14/15   Raeford Razor, MD  ibuprofen (ADVIL,MOTRIN) 800 MG tablet Take 1 tablet (800 mg total) by mouth 3 (three) times daily. 11/08/14   Cheri Fowler, PA-C  predniSONE (DELTASONE) 20 MG tablet Take 2 tablets (40 mg total) by mouth daily. 01/14/15   Raeford Razor, MD    Family History No family history on file.  Social History Social History  Substance Use Topics  . Smoking status: Former Games developer  . Smokeless tobacco: Never Used  . Alcohol use No     Allergies   Flagyl [metronidazole]; Other; Oxycodone; and Tramadol   Review of Systems Review of Systems  Constitutional: Negative for chills and fever.  HENT: Positive for congestion and rhinorrhea. Negative for sore throat.   Respiratory: Positive for cough. Negative for shortness of breath.   Cardiovascular: Negative for chest pain.  Musculoskeletal: Negative for myalgias.  All other systems reviewed and are negative.    Physical Exam Updated Vital Signs BP 154/98 (BP Location: Left Arm)   Pulse 78   Temp 98 F (36.7 C) (Oral)   Resp 20   Ht 5\' 9"  (1.753 m)   Wt 93 kg   SpO2 99%   BMI 30.27 kg/m   Physical Exam  Constitutional: He is oriented  to person, place, and time. He appears well-developed and well-nourished. No distress.  HENT:  Head: Normocephalic and atraumatic.  Eyes: Conjunctivae and EOM are normal. Right eye exhibits no discharge. Left eye exhibits no discharge. No scleral icterus.  Neck: Normal range of motion. Neck supple. No tracheal deviation present.  Cardiovascular: Normal rate, regular rhythm, normal heart sounds and intact distal pulses.  Exam reveals no gallop and no friction rub.   No murmur heard. Pulmonary/Chest: Effort  normal and breath sounds normal. No tachypnea. No respiratory distress. He has no decreased breath sounds. He has no wheezes.  Musculoskeletal: Normal range of motion.  Lymphadenopathy:    He has no cervical adenopathy.  Neurological: He is alert and oriented to person, place, and time.  Skin: Skin is warm and dry. Capillary refill takes less than 2 seconds. No pallor.  Psychiatric: He has a normal mood and affect. His behavior is normal.  Nursing note and vitals reviewed.    ED Treatments / Results  Labs (all labs ordered are listed, but only abnormal results are displayed) Labs Reviewed  D-DIMER, QUANTITATIVE (NOT AT [facility])    EKG  EKG Interpretation None       Radiology Dg Chest 2 View  Result Date: 01/20/2016 CLINICAL DATA:  Cough for 1 and half months. EXAM: CHEST  2 VIEW COMPARISON:  None FINDINGS: The heart size and mediastinal contours are within normal limits. Both lungs are clear. The visualized skeletal structures are unremarkable. IMPRESSION: No active cardiopulmonary disease. No evidence of pneumonia or pulmonary edema. Electronically Signed   By: Bary RichardStan  Maynard M.D.   On: 01/20/2016 18:07    Procedures Procedures (including critical care time)  DIAGNOSTIC STUDIES: Oxygen Saturation is 99% on RA, normal by my interpretation.    COORDINATION OF CARE: 9:58 PM Discussed treatment plan with pt at bedside and pt agreed to plan.  Medications Ordered in ED Medications  ipratropium-albuterol (DUONEB) 0.5-2.5 (3) MG/3ML nebulizer solution 3 mL (3 mLs Nebulization Given 01/20/16 2207)  predniSONE (DELTASONE) tablet 60 mg (60 mg Oral Given 01/20/16 2209)  benzonatate (TESSALON) capsule 100 mg (100 mg Oral Given 01/20/16 2209)     Initial Impression / Assessment and Plan / ED Course  I have reviewed the triage vital signs and the nursing notes.  Pertinent labs & imaging results that were available during my care of the patient were reviewed by me and considered in my  medical decision making (see chart for details).  Clinical Course   Patient presents with cough and hemoptysis. Patient travels back and forth to [place]. Denies sob or cough. D dimer negative. Low suspicion for PE. CXR without signs of infiltrate. Lungs are CTAB. Likely viral bronchitis. Given steroids, albuterol, and cough medicine. Encouraged OTC symptomatic treatment. Pt is hemodynamically stable, in NAD, & able to ambulate in the ED. Pt has no complaints prior to dc. Pt is comfortable with above plan and is stable for discharge at this time. All questions were answered prior to disposition. Strict return precautions for f/u to the ED were discussed.   Final Clinical Impressions(s) / ED Diagnoses   Final diagnoses:  Bronchitis    New Prescriptions Discharge Medication List as of 01/20/2016 10:49 PM    START taking these medications   Details  albuterol (PROVENTIL HFA;VENTOLIN HFA) 108 (90 Base) MCG/ACT inhaler Inhale 1-2 puffs into the lungs every 6 (six) hours as needed for wheezing or shortness of breath., Starting Tue 01/20/2016, Print    chlorpheniramine-HYDROcodone [facility](TUSSIONEX PENNKINETIC ER)  10-8 MG/5ML SUER Take 5 mLs by mouth every 12 (twelve) hours as needed for cough., Starting Tue 01/20/2016, Print       I personally performed the services described in this documentation, which was scribed in my presence. The recorded information has been reviewed and is accurate.    Rise Mu, PA-C 01/22/16 2124    Courteney Randall An, MD 01/23/16 2311

## 2016-01-20 NOTE — Discharge Instructions (Signed)
This is likely bronchitis. Please take the prednisone once a day for 3 days. Use the albuterol inhaler as needed. Take the cough medicine at night for cough. Continue using your over-the-counter Mucinex. This is likely a viral illness. If your symptoms persist please return to the ED. Follow-up with your primary care doctor. He will need his blood pressure rechecked.

## 2016-01-21 MED FILL — HYDROCODONE-CHLORPHENIRAM S: 10-8 | 10 days supply | Qty: 100 | Fill #0

## 2016-01-30 ENCOUNTER — Emergency Department (HOSPITAL_BASED_OUTPATIENT_CLINIC_OR_DEPARTMENT_OTHER): Payer: PRIVATE HEALTH INSURANCE

## 2016-01-30 ENCOUNTER — Encounter (HOSPITAL_BASED_OUTPATIENT_CLINIC_OR_DEPARTMENT_OTHER): Payer: Self-pay | Admitting: *Deleted

## 2016-01-30 ENCOUNTER — Emergency Department (HOSPITAL_BASED_OUTPATIENT_CLINIC_OR_DEPARTMENT_OTHER)
Admission: EM | Admit: 2016-01-30 | Discharge: 2016-01-30 | Disposition: A | Discharge status: Home / Self Care | Payer: PRIVATE HEALTH INSURANCE | Attending: Dermatology | Admitting: Dermatology

## 2016-01-30 DIAGNOSIS — Z79899 Other long term (current) drug therapy: Secondary | ICD-10-CM | POA: Insufficient documentation

## 2016-01-30 DIAGNOSIS — Z791 Long term (current) use of non-steroidal anti-inflammatories (NSAID): Secondary | ICD-10-CM | POA: Insufficient documentation

## 2016-01-30 DIAGNOSIS — R05 Cough: Secondary | ICD-10-CM | POA: Insufficient documentation

## 2016-01-30 DIAGNOSIS — Z79891 Long term (current) use of opiate analgesic: Secondary | ICD-10-CM | POA: Insufficient documentation

## 2016-01-30 DIAGNOSIS — Z5321 Procedure and treatment not carried out due to patient leaving prior to being seen by health care provider: Secondary | ICD-10-CM | POA: Insufficient documentation

## 2016-01-30 NOTE — ED Triage Notes (Signed)
Continued cough. He was here a week ago for same.

## 2016-01-30 NOTE — ED Notes (Signed)
Pt left and said he would come back later to registration

## 2016-01-31 ENCOUNTER — Emergency Department (HOSPITAL_BASED_OUTPATIENT_CLINIC_OR_DEPARTMENT_OTHER): Payer: Self-pay

## 2016-01-31 ENCOUNTER — Emergency Department (HOSPITAL_BASED_OUTPATIENT_CLINIC_OR_DEPARTMENT_OTHER)
Admission: EM | Admit: 2016-01-31 | Discharge: 2016-01-31 | Disposition: A | Discharge status: Home / Self Care | Payer: Self-pay | Attending: Emergency Medicine | Admitting: Emergency Medicine

## 2016-01-31 ENCOUNTER — Encounter (HOSPITAL_BASED_OUTPATIENT_CLINIC_OR_DEPARTMENT_OTHER): Payer: Self-pay | Admitting: Emergency Medicine

## 2016-01-31 DIAGNOSIS — J069 Acute upper respiratory infection, unspecified: Secondary | ICD-10-CM | POA: Insufficient documentation

## 2016-01-31 DIAGNOSIS — Z87891 Personal history of nicotine dependence: Secondary | ICD-10-CM | POA: Insufficient documentation

## 2016-01-31 DIAGNOSIS — Z79899 Other long term (current) drug therapy: Secondary | ICD-10-CM | POA: Insufficient documentation

## 2016-01-31 DIAGNOSIS — R05 Cough: Secondary | ICD-10-CM

## 2016-01-31 DIAGNOSIS — R059 Cough, unspecified: Secondary | ICD-10-CM

## 2016-01-31 MED ORDER — SALINE SPRAY 0.65 % NA SOLN: 1.0000 | NASAL | 0 refills | Status: DC | PRN

## 2016-01-31 MED ORDER — AZITHROMYCIN 250 MG PO TABS: 250.0000 mg | ORAL_TABLET | Freq: Every day | ORAL | 0 refills | Status: DC

## 2016-01-31 NOTE — ED Provider Notes (Signed)
[facility] Provider Note   CSN: [id number] Arrival date & time: 01/31/16  1244     History   Chief Complaint Chief Complaint  Patient presents with  . Cough  . Fever    HPI Justin Rios is a 56 y.o. male.  Patient presents emergency department with chief complaint of cough 6 weeks. He also reports intermittent fevers chills. He states that he does occasionally have some green sputum. He also reports having sore throat. He reports symptoms have been intermittent. He was seen a week ago for the same, and given cough medicine. He denies any other associated symptoms. Denies any chest pain, shortness of breath, nausea, vomiting, diarrhea.   The history is provided by the patient. No language interpreter was used.    Past Medical History:  Diagnosis Date  . Allergy     There are no active problems to display for this patient.   Past Surgical History:  Procedure Laterality Date  . FRACTURE SURGERY  07/18/2013   R ankle surgery; [facility]       Home Medications    Prior to Admission medications   Medication Sig Start Date End Date Taking? Authorizing Provider  esomeprazole (NEXIUM) 20 MG capsule Take 20 mg by mouth daily at 12 noon.   Yes Historical Provider, MD  ibuprofen (ADVIL,MOTRIN) 800 MG tablet Take 1 tablet (800 mg total) by mouth 3 (three) times daily. 11/08/14  Yes Cheri FowlerKayla Rose, PA-C  albuterol (PROVENTIL HFA;VENTOLIN HFA) 108 (90 Base) MCG/ACT inhaler Inhale 1-2 puffs into the lungs every 6 (six) hours as needed for wheezing or shortness of breath. 01/20/16   Rise MuKenneth T Leaphart, PA-C  chlorpheniramine-HYDROcodone (TUSSIONEX PENNKINETIC ER) 10-8 MG/5ML SUER Take 5 mLs by mouth every 12 (twelve) hours as needed for cough. 01/20/16   Rise MuKenneth T Leaphart, PA-C  cyclobenzaprine (FLEXERIL) 10 MG tablet Take 1 tablet (10 mg total) by mouth 2 (two) times daily as needed for muscle spasms. 02/03/15   Barrett HenleNicole Elizabeth Nadeau, PA-C  diclofenac sodium (VOLTAREN) 1 %  GEL Apply 2 g topically 4 (four) times daily. 11/27/14   Hannah Muthersbaugh, PA-C  gabapentin (NEURONTIN) 100 MG capsule Take 1 capsule (100 mg total) by mouth 3 (three) times daily. 01/14/15   Raeford RazorStephen Kohut, MD  HYDROcodone-acetaminophen (NORCO/VICODIN) 5-325 MG tablet Take 2 tablets by mouth every 4 (four) hours as needed. 01/14/15   Raeford RazorStephen Kohut, MD  predniSONE (DELTASONE) 20 MG tablet Take 2 tablets (40 mg total) by mouth daily with breakfast. 01/20/16   Rise MuKenneth T Leaphart, PA-C    Family History No family history on file.  Social History Social History  Substance Use Topics  . Smoking status: Former Games developermoker  . Smokeless tobacco: Never Used  . Alcohol use No     Allergies   Clindamycin/lincomycin; Flagyl [metronidazole]; Other; Oxycodone; and Tramadol   Review of Systems Review of Systems  Respiratory: Positive for cough.   All other systems reviewed and are negative.    Physical Exam Updated Vital Signs BP 139/94 (BP Location: Left Arm)   Pulse 98   Temp 98 F (36.7 C) (Oral)   Resp 18   Ht 5\' 9"  (1.753 m)   Wt 93 kg   SpO2 98%   BMI 30.27 kg/m   Physical Exam Physical Exam  Constitutional: Pt  is oriented to person, place, and time. Appears well-developed and well-nourished. No distress.  HENT:  Head: Normocephalic and atraumatic.  Right Ear: Tympanic membrane, external ear and ear canal normal.  Left Ear: Tympanic membrane, external ear and ear canal normal.  Nose: Mucosal edema and mild rhinorrhea present. No epistaxis. Right sinus exhibits no maxillary sinus tenderness and no frontal sinus tenderness. Left sinus exhibits no maxillary sinus tenderness and no frontal sinus tenderness.  Mouth/Throat: Uvula is midline and mucous membranes are normal. Mucous membranes are not pale and not cyanotic. No oropharyngeal exudate, posterior oropharyngeal edema, posterior oropharyngeal erythema or tonsillar abscesses.  Eyes: Conjunctivae are normal. Pupils are equal,  round, and reactive to light.  Neck: Normal range of motion and full passive range of motion without pain.  Cardiovascular: Normal rate and intact distal pulses.   Pulmonary/Chest: Effort normal and breath sounds normal. No stridor.  Clear and equal breath sounds without focal wheezes, rhonchi, rales  Abdominal: Soft. Bowel sounds are normal. There is no tenderness.  Musculoskeletal: Normal range of motion.  Lymphadenopathy:    Pthas no cervical adenopathy.  Neurological: Pt is alert and oriented to person, place, and time.  Skin: Skin is warm and dry. No rash noted. Pt is not diaphoretic.  Psychiatric: Normal mood and affect.  Nursing note and vitals reviewed.    ED Treatments / Results  Labs (all labs ordered are listed, but only abnormal results are displayed) Labs Reviewed - No data to display  EKG  EKG Interpretation None       Radiology Dg Chest 2 View  Result Date: 01/31/2016 CLINICAL DATA:  Cough. EXAM: CHEST  2 VIEW COMPARISON:  01/20/2016 FINDINGS: The heart size and mediastinal contours are within normal limits. Both lungs are clear. No pleural effusion or pneumothorax. The visualized skeletal structures are unremarkable. IMPRESSION: No active cardiopulmonary disease. Electronically Signed   By: Amie Portland M.D.   On: 01/31/2016 14:28    Procedures Procedures (including critical care time)  Medications Ordered in ED Medications - No data to display   Initial Impression / Assessment and Plan / ED Course  I have reviewed the triage vital signs and the nursing notes.  Pertinent labs & imaging results that were available during my care of the patient were reviewed by me and considered in my medical decision making (see chart for details).  Clinical Course     Patient with URI symptoms, however given length of symptoms, patient may benefit from some azithromycin.  Patient also takes Nexium occasionally, symptoms could be related to acid reflux, I have  encouraged patient to take the Nexium daily. Recommend primary care follow-up. Patient understands and agrees plan. He is stable and ready for discharge.  Final Clinical Impressions(s) / ED Diagnoses   Final diagnoses:  Upper respiratory tract infection, unspecified type  Cough    New Prescriptions New Prescriptions   AZITHROMYCIN (ZITHROMAX) 250 MG TABLET    Take 1 tablet (250 mg total) by mouth daily. Take first 2 tablets together, then 1 every day until finished.   SODIUM CHLORIDE (OCEAN) 0.65 % SOLN NASAL SPRAY    Place 1 spray into both nostrils as needed for congestion.     Roxy Horseman, PA-C 01/31/16 1450    Charlynne Pander, MD 01/31/16 [phone]

## 2016-01-31 NOTE — ED Triage Notes (Signed)
Pt with continued cough and fever x6 weeks total. State I had a chest xray last Friday and was neg for pneumonia but I think I need a z-pack or different antibiotic.

## 2016-01-31 NOTE — ED Notes (Signed)
Roxy Horsemanobert Browning, PA-C at bedside now.

## 2016-04-02 ENCOUNTER — Emergency Department (HOSPITAL_BASED_OUTPATIENT_CLINIC_OR_DEPARTMENT_OTHER)
Admission: EM | Admit: 2016-04-02 | Discharge: 2016-04-02 | Disposition: A | Discharge status: Home / Self Care | Payer: PRIVATE HEALTH INSURANCE | Attending: Emergency Medicine | Admitting: Emergency Medicine

## 2016-04-02 ENCOUNTER — Encounter (HOSPITAL_BASED_OUTPATIENT_CLINIC_OR_DEPARTMENT_OTHER): Payer: Self-pay | Admitting: Emergency Medicine

## 2016-04-02 DIAGNOSIS — F17290 Nicotine dependence, other tobacco product, uncomplicated: Secondary | ICD-10-CM | POA: Insufficient documentation

## 2016-04-02 DIAGNOSIS — H6123 Impacted cerumen, bilateral: Secondary | ICD-10-CM | POA: Insufficient documentation

## 2016-04-02 DIAGNOSIS — Z79899 Other long term (current) drug therapy: Secondary | ICD-10-CM | POA: Insufficient documentation

## 2016-04-02 NOTE — ED Triage Notes (Signed)
Patient c/o left ear pain, dizziness, and off balance. Reports this is intermittent for past two weeks.

## 2016-04-02 NOTE — ED Provider Notes (Signed)
[facility]   CSN: [id number] Arrival date & time: 04/02/16  1532     History   Chief Complaint Chief Complaint  Patient presents with  . Dizziness    left ear pain, off balance, dizzy    HPI Justin Rios is a 56 y.o. male.  HPI   Justin Rios is a 56 y.o. male, patient with no pertinent past medical history, presenting to the ED with decreased hearing and intermittent sensation of disequilibrium for the past two weeks. Patient states this has happened before when he had a cerumen impaction on the right side. He endorses some nausea with the disequilibrium, but is not experiencing either of those symptoms currently. He adds he is just "getting over a cold" where he had a lot of upper respiratory congestion. He denies fever/chills, vomiting, falls/trauma, numbness, weakness, or any other complaints.      Past Medical History:  Diagnosis Date  . Allergy     There are no active problems to display for this patient.   Past Surgical History:  Procedure Laterality Date  . FRACTURE SURGERY  07/18/2013   R ankle surgery; [facility]       Home Medications    Prior to Admission medications   Medication Sig Start Date End Date Taking? Authorizing Provider  esomeprazole (NEXIUM) 20 MG capsule Take 20 mg by mouth daily at 12 noon.   Yes Historical Provider, MD    Family History History reviewed. No pertinent family history.  Social History Social History  Substance Use Topics  . Smoking status: Current Some Day Smoker    Types: Cigars  . Smokeless tobacco: Never Used  . Alcohol use Yes     Comment: occ     Allergies   Clindamycin/lincomycin; Flagyl [metronidazole]; Other; Oxycodone; and Tramadol   Review of Systems Review of Systems  Constitutional: Negative for chills and fever.  HENT: Positive for hearing loss. Negative for ear discharge.   Respiratory: Negative for shortness of breath.   Cardiovascular: Negative for chest  pain.  Neurological: Negative for weakness, numbness and headaches.       Balance difficulty  All other systems reviewed and are negative.    Physical Exam Updated Vital Signs BP (!) 138/91 (BP Location: Left Arm)   Pulse 83   Temp 97.9 F (36.6 C) (Oral)   Resp 20   Ht 5\' 9"  (1.753 m)   Wt 97.5 kg   SpO2 98%   BMI 31.75 kg/m   Physical Exam  Constitutional: He appears well-developed and well-nourished. No distress.  HENT:  Head: Normocephalic and atraumatic.  Right Ear: External ear normal.  Left Ear: External ear normal.  Mouth/Throat: Oropharynx is clear and moist.  Cerumen buildup bilaterally, but complete impaction on the left.  After disimpaction, the patient's TMs appear intact bilaterally and canals are normal. Hearing also returned normal, per patient.  Eyes: Conjunctivae and EOM are normal. Pupils are equal, round, and reactive to light.  Neck: Normal range of motion. Neck supple.  Cardiovascular: Normal rate, regular rhythm, normal heart sounds and intact distal pulses.   Pulmonary/Chest: Effort normal and breath sounds normal. No respiratory distress.  Abdominal: Soft. There is no tenderness. There is no guarding.  Musculoskeletal: He exhibits no edema.  Lymphadenopathy:    He has no cervical adenopathy.  Neurological: He is alert.  No sensory deficits. Strength 5/5 in all extremities. No gait disturbance. Coordination intact including heel to shin and finger to nose. Cranial nerves III-XII  grossly intact. No facial droop.   Skin: Skin is warm and dry. He is not diaphoretic.  Psychiatric: He has a normal mood and affect. His behavior is normal.  Nursing note and vitals reviewed.    ED Treatments / Results  Labs (all labs ordered are listed, but only abnormal results are displayed) Labs Reviewed - No data to display  EKG  EKG Interpretation None       Radiology No results found.  Procedures .Ear Cerumen Removal Date/Time: 04/02/2016 4:33  PM Performed by: Renaldo Fiddler, CRYSTAL L Authorized by: Harolyn Rutherford C   Consent:    Consent obtained:  Verbal   Consent given by:  Patient   Risks discussed:  Dizziness, bleeding, infection, incomplete removal, pain and TM perforation Procedure details:    Location:  L ear   Procedure type: irrigation   Post-procedure details:    Inspection:  TM intact   Hearing quality:  Normal   Patient tolerance of procedure:  Tolerated well, no immediate complications  .Ear Cerumen Removal Date/Time: 04/02/2016 5:04 PM Performed by: Renaldo Fiddler, CRYSTAL L Authorized by: Harolyn Rutherford C   Consent:    Consent obtained:  Verbal   Consent given by:  Patient   Risks discussed:  Bleeding, dizziness, incomplete removal, infection, pain and TM perforation   Alternatives discussed:  No treatment Procedure details:    Location:  R ear   Procedure type: irrigation   Post-procedure details:    Inspection:  TM intact   Hearing quality:  Normal   Patient tolerance of procedure:  Tolerated well, no immediate complications   (including critical care time)  Medications Ordered in ED Medications - No data to display   Initial Impression / Assessment and Plan / ED Course  I have reviewed the triage vital signs and the nursing notes.  Pertinent labs & imaging results that were available during my care of the patient were reviewed by me and considered in my medical decision making (see chart for details).     Patient presents with complaint of a feeling of ear fullness and discomfort along with some disequilibrium. No neuro deficits. Full evaluation performed due to patient's presenting complaint of dizziness. Patient has bilateral cerumen impactions. Corrected here in the ED without immediate complication. Patient advised to follow-up with a PCP for any future management of this type. Home management and return precautions discussed.    Final Clinical Impressions(s) / ED Diagnoses   Final diagnoses:  Bilateral  impacted cerumen    New Prescriptions New Prescriptions   No medications on file     Concepcion Living 04/02/16 1710    Lyndal Pulley, MD 04/04/16 [phone]

## 2016-04-02 NOTE — ED Notes (Signed)
PT discharged to home NAD.  

## 2016-04-02 NOTE — Discharge Instructions (Addendum)
You had ear wax buildup in both ears. This was corrected here in the ED. These types of rinses can be performed at home. Follow up with a primary care provider for any future issues of this type.

## 2018-09-17 IMAGING — CR DG CHEST 2V
2 series · 2 of 2 positions shown · non-contrast
Comparison: None

CLINICAL DATA: Cough for 1 and half months.

EXAM:
CHEST  2 VIEW

[w chest pa]
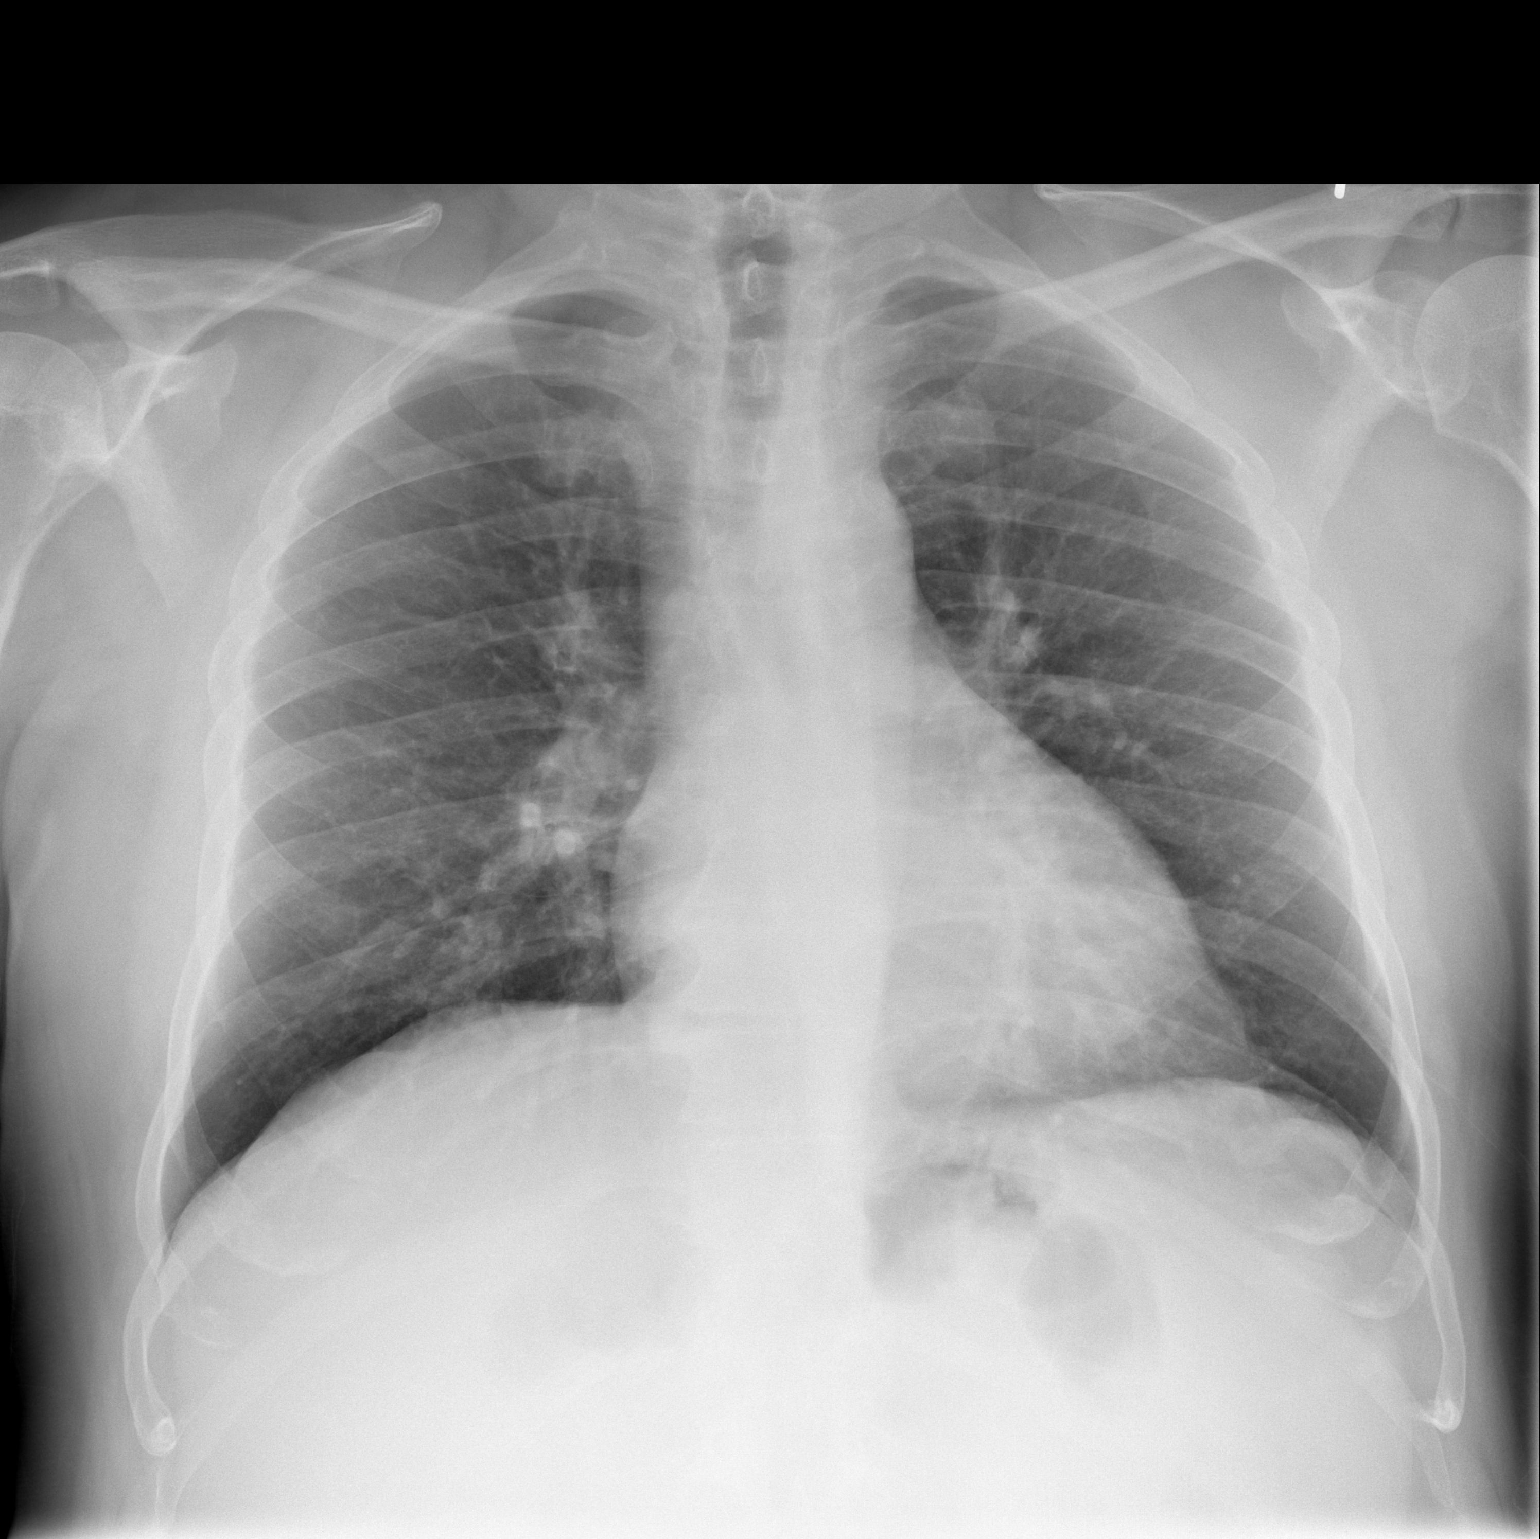

[w chest lat]
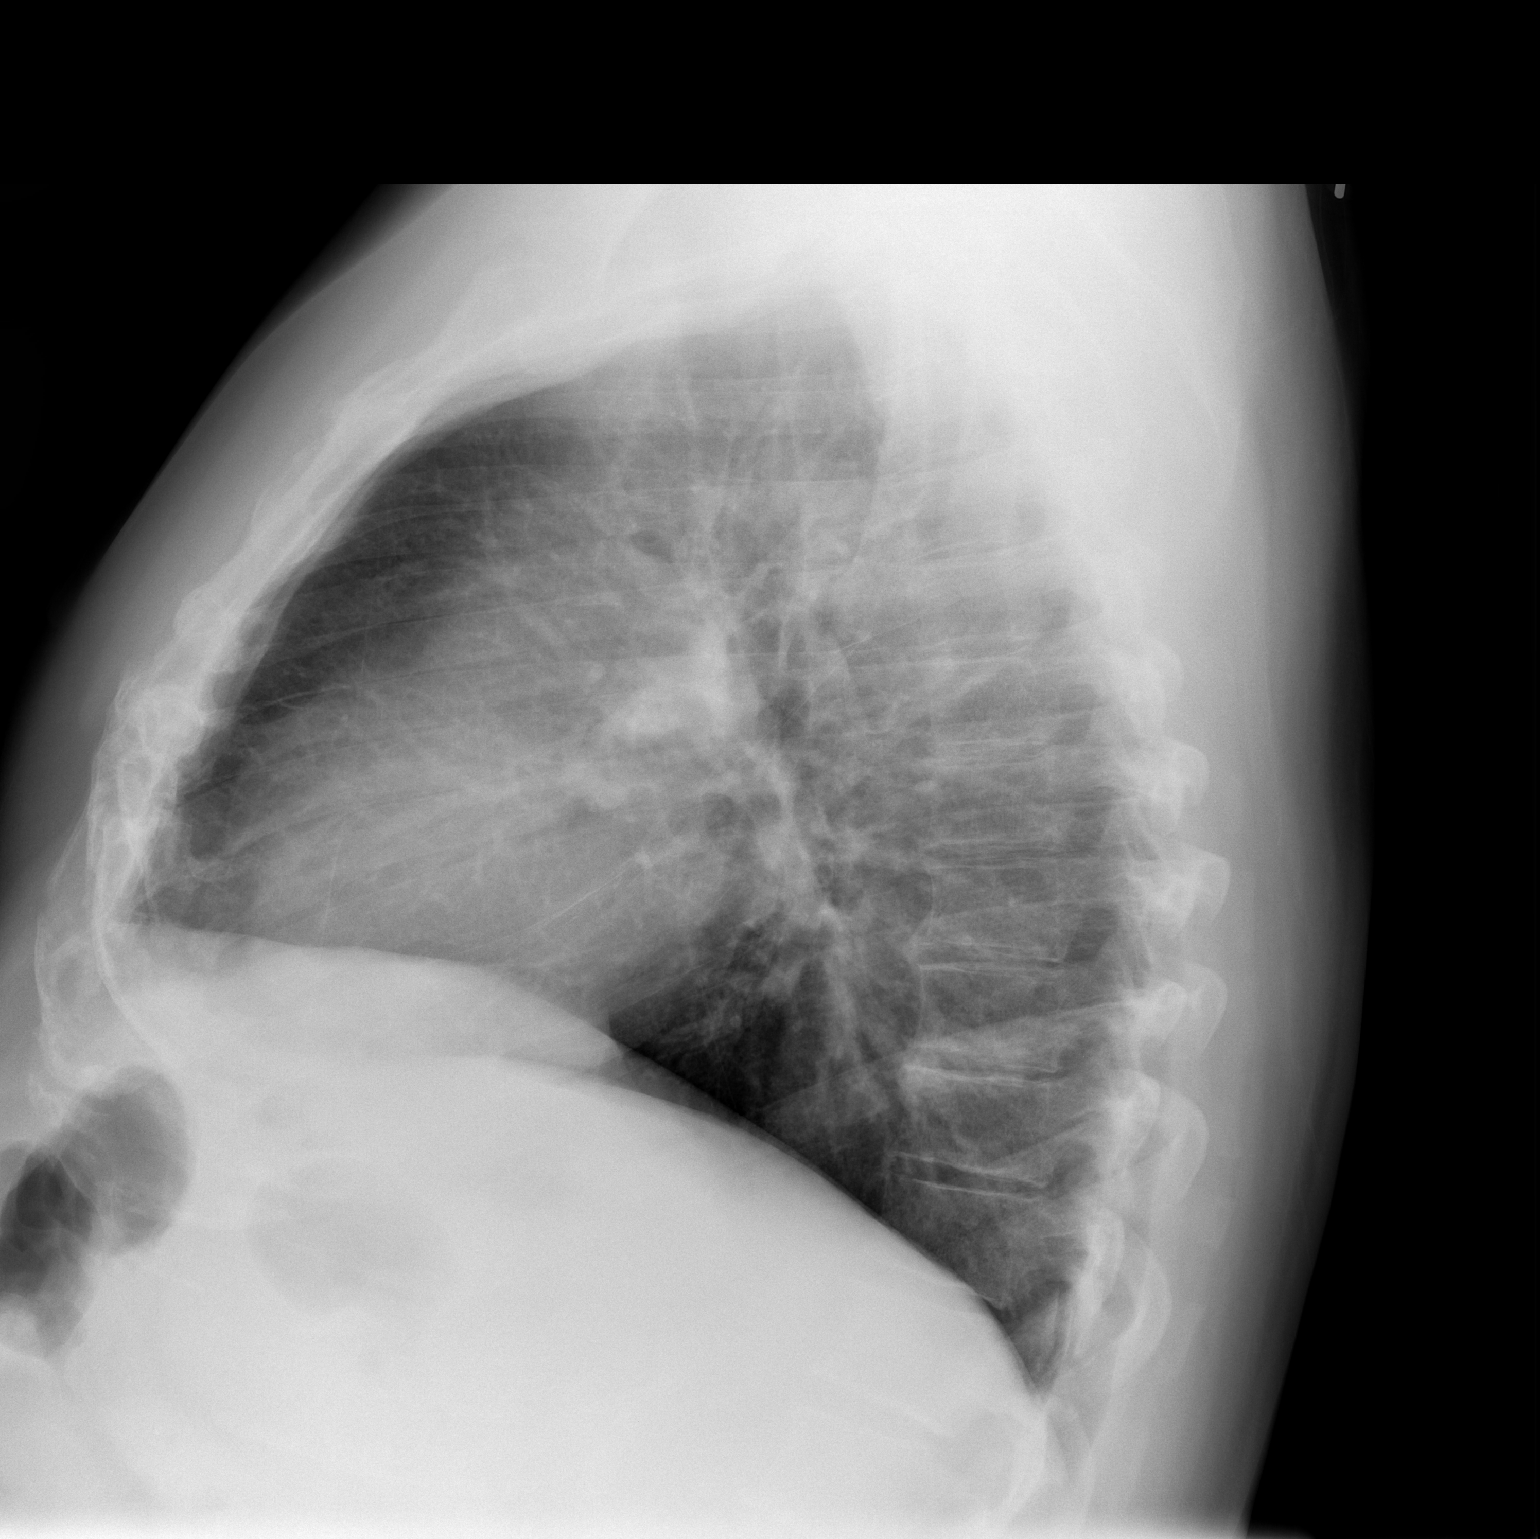

[2 of 2 positions shown; findings below may reference images not displayed]

FINDINGS: The heart size and mediastinal contours are within normal limits.
Both lungs are clear. The visualized skeletal structures are
unremarkable.
IMPRESSION: No active cardiopulmonary disease. No evidence of pneumonia or
pulmonary edema.
